# Patient Record
Sex: Male | Born: 1950 | Race: White | Hispanic: No | Smoking: Current every day smoker
Health system: Southern US, Community
[De-identification: ages and names within clinical notes are randomized; demographics above are authoritative.]

## PROBLEM LIST (undated history)

## (undated) DIAGNOSIS — E7800 Pure hypercholesterolemia, unspecified: Secondary | ICD-10-CM

## (undated) DIAGNOSIS — J449 Chronic obstructive pulmonary disease, unspecified: Secondary | ICD-10-CM

## (undated) DIAGNOSIS — I10 Essential (primary) hypertension: Secondary | ICD-10-CM

## (undated) DIAGNOSIS — R0600 Dyspnea, unspecified: Secondary | ICD-10-CM

## (undated) DIAGNOSIS — K219 Gastro-esophageal reflux disease without esophagitis: Secondary | ICD-10-CM

## (undated) DIAGNOSIS — R7303 Prediabetes: Secondary | ICD-10-CM

## (undated) DIAGNOSIS — Z8489 Family history of other specified conditions: Secondary | ICD-10-CM

## (undated) DIAGNOSIS — M1990 Unspecified osteoarthritis, unspecified site: Secondary | ICD-10-CM

## (undated) HISTORY — PX: CATARACT EXTRACTION: SUR2

## (undated) HISTORY — PX: LUMBAR DISC SURGERY: SHX700

---

## 2006-01-18 ENCOUNTER — Ambulatory Visit: Payer: Self-pay | Admitting: Gastroenterology

## 2006-03-14 ENCOUNTER — Ambulatory Visit: Payer: Self-pay | Admitting: Gastroenterology

## 2009-11-21 ENCOUNTER — Ambulatory Visit: Payer: Self-pay | Admitting: Unknown Physician Specialty

## 2017-09-24 HISTORY — PX: CARDIAC VALVE REPLACEMENT: SHX585

## 2018-06-06 DIAGNOSIS — I739 Peripheral vascular disease, unspecified: Secondary | ICD-10-CM | POA: Insufficient documentation

## 2018-06-09 DIAGNOSIS — I350 Nonrheumatic aortic (valve) stenosis: Secondary | ICD-10-CM | POA: Insufficient documentation

## 2018-06-15 DIAGNOSIS — I480 Paroxysmal atrial fibrillation: Secondary | ICD-10-CM | POA: Insufficient documentation

## 2018-06-15 DIAGNOSIS — G8918 Other acute postprocedural pain: Secondary | ICD-10-CM | POA: Insufficient documentation

## 2018-06-15 DIAGNOSIS — Z952 Presence of prosthetic heart valve: Secondary | ICD-10-CM | POA: Insufficient documentation

## 2018-06-23 DIAGNOSIS — Z7901 Long term (current) use of anticoagulants: Secondary | ICD-10-CM | POA: Insufficient documentation

## 2019-12-30 ENCOUNTER — Emergency Department (HOSPITAL_COMMUNITY): Payer: MEDICARE

## 2019-12-30 ENCOUNTER — Other Ambulatory Visit: Payer: Self-pay

## 2019-12-30 ENCOUNTER — Encounter (HOSPITAL_COMMUNITY): Payer: Self-pay | Admitting: Emergency Medicine

## 2019-12-30 ENCOUNTER — Inpatient Hospital Stay (HOSPITAL_COMMUNITY)
Admission: EM | Admit: 2019-12-30 | Discharge: 2019-12-31 | DRG: 069 | Disposition: A | Discharge status: Home / Self Care | Payer: MEDICARE | Attending: Family Medicine | Admitting: Family Medicine

## 2019-12-30 DIAGNOSIS — E785 Hyperlipidemia, unspecified: Secondary | ICD-10-CM | POA: Diagnosis not present

## 2019-12-30 DIAGNOSIS — Z87891 Personal history of nicotine dependence: Secondary | ICD-10-CM

## 2019-12-30 DIAGNOSIS — Z7982 Long term (current) use of aspirin: Secondary | ICD-10-CM | POA: Diagnosis not present

## 2019-12-30 DIAGNOSIS — H532 Diplopia: Secondary | ICD-10-CM | POA: Diagnosis present

## 2019-12-30 DIAGNOSIS — Z952 Presence of prosthetic heart valve: Secondary | ICD-10-CM

## 2019-12-30 DIAGNOSIS — Z20822 Contact with and (suspected) exposure to covid-19: Secondary | ICD-10-CM | POA: Diagnosis present

## 2019-12-30 DIAGNOSIS — R519 Headache, unspecified: Secondary | ICD-10-CM

## 2019-12-30 DIAGNOSIS — R29898 Other symptoms and signs involving the musculoskeletal system: Secondary | ICD-10-CM | POA: Diagnosis present

## 2019-12-30 DIAGNOSIS — G459 Transient cerebral ischemic attack, unspecified: Secondary | ICD-10-CM | POA: Diagnosis not present

## 2019-12-30 DIAGNOSIS — H4922 Sixth [abducent] nerve palsy, left eye: Secondary | ICD-10-CM | POA: Diagnosis present

## 2019-12-30 DIAGNOSIS — Z79899 Other long term (current) drug therapy: Secondary | ICD-10-CM | POA: Diagnosis not present

## 2019-12-30 DIAGNOSIS — I160 Hypertensive urgency: Secondary | ICD-10-CM | POA: Diagnosis present

## 2019-12-30 DIAGNOSIS — I10 Essential (primary) hypertension: Secondary | ICD-10-CM | POA: Diagnosis not present

## 2019-12-30 DIAGNOSIS — I161 Hypertensive emergency: Secondary | ICD-10-CM

## 2019-12-30 HISTORY — DX: Essential (primary) hypertension: I10

## 2019-12-30 LAB — BASIC METABOLIC PANEL
Anion gap: 10 (ref 5–15)
BUN: 12 mg/dL (ref 8–23)
CO2: 27 mmol/L (ref 22–32)
Calcium: 9.3 mg/dL (ref 8.9–10.3)
Chloride: 101 mmol/L (ref 98–111)
Creatinine, Ser: 0.77 mg/dL (ref 0.61–1.24)
GFR calc Af Amer: >60 mL/min (ref >60)
GFR calc non Af Amer: >60 mL/min (ref >60)
Glucose, Bld: 166 mg/dL — ABNORMAL HIGH (ref 70–99)
Potassium: 4 mmol/L (ref 3.5–5.1)
Sodium: 138 mmol/L (ref 135–145)

## 2019-12-30 LAB — PROTIME-INR
INR: 1.1 (ref 0.8–1.2)
Prothrombin Time: 13.7 seconds (ref 11.4–15.2)

## 2019-12-30 LAB — URINALYSIS, ROUTINE W REFLEX MICROSCOPIC
Bilirubin Urine: NEGATIVE
Glucose, UA: NEGATIVE mg/dL
Hgb urine dipstick: NEGATIVE
Ketones, ur: NEGATIVE mg/dL
Leukocytes,Ua: NEGATIVE
Nitrite: NEGATIVE
Protein, ur: NEGATIVE mg/dL
Specific Gravity, Urine: 1.015 (ref 1.005–1.030)
pH: 6 (ref 5.0–8.0)

## 2019-12-30 LAB — CBC WITH DIFFERENTIAL/PLATELET
Abs Immature Granulocytes: 0.03 10*3/uL (ref 0.00–0.07)
Basophils Absolute: 0 10*3/uL (ref 0.0–0.1)
Basophils Relative: 0 %
Eosinophils Absolute: 0.1 10*3/uL (ref 0.0–0.5)
Eosinophils Relative: 1 %
HCT: 43.3 % (ref 39.0–52.0)
Hemoglobin: 14.3 g/dL (ref 13.0–17.0)
Immature Granulocytes: 1 %
Lymphocytes Relative: 21 %
Lymphs Abs: 1.3 10*3/uL (ref 0.7–4.0)
MCH: 32.2 pg (ref 26.0–34.0)
MCHC: 33 g/dL (ref 30.0–36.0)
MCV: 97.5 fL (ref 80.0–100.0)
Monocytes Absolute: 0.6 10*3/uL (ref 0.1–1.0)
Monocytes Relative: 9 %
Neutro Abs: 4.4 10*3/uL (ref 1.7–7.7)
Neutrophils Relative %: 68 %
Platelets: 167 10*3/uL (ref 150–400)
RBC: 4.44 MIL/uL (ref 4.22–5.81)
RDW: 13.2 % (ref 11.5–15.5)
WBC: 6.3 10*3/uL (ref 4.0–10.5)
nRBC: 0 % (ref 0.0–0.2)

## 2019-12-30 LAB — CBC
HCT: 47.6 % (ref 39.0–52.0)
Hemoglobin: 15.5 g/dL (ref 13.0–17.0)
MCH: 31.6 pg (ref 26.0–34.0)
MCHC: 32.6 g/dL (ref 30.0–36.0)
MCV: 97.1 fL (ref 80.0–100.0)
Platelets: 160 10*3/uL (ref 150–400)
RBC: 4.9 MIL/uL (ref 4.22–5.81)
RDW: 13.2 % (ref 11.5–15.5)
WBC: 8.7 10*3/uL (ref 4.0–10.5)
nRBC: 0 % (ref 0.0–0.2)

## 2019-12-30 LAB — ETHANOL: Alcohol, Ethyl (B): <10 mg/dL (ref <10)

## 2019-12-30 MED ORDER — HYDRALAZINE HCL 20 MG/ML IJ SOLN
20.0000 mg | Freq: Once | INTRAMUSCULAR | Status: AC
  Administered 2019-12-30: 20 mg via INTRAVENOUS
  Filled 2019-12-30: qty 1

## 2019-12-30 MED ORDER — HYDRALAZINE HCL 20 MG/ML IJ SOLN
5.0000 mg | Freq: Once | INTRAMUSCULAR | Status: DC
  Administered 2019-12-30: 5 mg via INTRAVENOUS
  Filled 2019-12-30: qty 1

## 2019-12-30 MED ORDER — ACETAMINOPHEN 325 MG PO TABS: 650.0000 mg | ORAL_TABLET | ORAL | Status: DC | PRN

## 2019-12-30 MED ORDER — MORPHINE SULFATE (PF) 2 MG/ML IV SOLN
2.0000 mg | Freq: Once | INTRAVENOUS | Status: AC
  Administered 2019-12-30: 2 mg via INTRAVENOUS
  Filled 2019-12-30: qty 1

## 2019-12-30 MED ORDER — STROKE: EARLY STAGES OF RECOVERY BOOK
Freq: Once | Status: DC
  Filled 2019-12-30: qty 1

## 2019-12-30 MED ORDER — ENOXAPARIN SODIUM 40 MG/0.4ML ~~LOC~~ SOLN: 40.0000 mg | SUBCUTANEOUS | Status: DC

## 2019-12-30 MED ORDER — ACETAMINOPHEN 650 MG RE SUPP: 650.0000 mg | RECTAL | Status: DC | PRN

## 2019-12-30 MED ORDER — IOHEXOL 350 MG/ML SOLN
100.0000 mL | Freq: Once | INTRAVENOUS | Status: AC | PRN
  Administered 2019-12-30: 100 mL via INTRAVENOUS

## 2019-12-30 MED ORDER — HYDRALAZINE HCL 20 MG/ML IJ SOLN: 20.0000 mg | Freq: Four times a day (QID) | INTRAMUSCULAR | Status: DC | PRN

## 2019-12-30 MED ORDER — SODIUM CHLORIDE 0.9 % IV SOLN: INTRAVENOUS | Status: DC

## 2019-12-30 MED ORDER — ACETAMINOPHEN 160 MG/5ML PO SOLN: 650.0000 mg | ORAL | Status: DC | PRN

## 2019-12-30 NOTE — H&P (Signed)
History and Physical   Ryan Weeks. WFU:[id number] DOB: [birth year] DOA: 12/30/2019  Referring MD/NP/PA: Clarice Pole  PCP: Kandyce Rud, MD   Outpatient Specialists: None  Patient coming from: Home  Chief Complaint: Blurred vision and headache  HPI: Ryan Weeks. is a 69 y.o. male with medical history significant of hypertension who reported sudden onset of double vision today around 11:30 in the morning.  Patient was at work when he bent over and all of a sudden he started having very bad headache behind the left eye.  The headache has been continuous persistent rated as 9 out of 10.  Not much relief from anything.  He subsequently developed blurry vision in the left eye.  He was having difficulty accommodating and also focusing with his vision.  His get some improvement prior to coming it but still having double vision.  This is worse when he looks more to the left side.  He has associated nausea but no vomiting.  Headache is much better now in the ER after treatment.  Usually blood pressure is controlled but noted to have hypertensive urgency in the ER.  Neurology has been consulted at this point.  CVA suspected and patient is getting work-up for it..  ED Course: Temperature 98 for blood pressure 257/87 pulse 49 respirate 22 oxygen sats 94% room air.  CBC and chemistry entirely within normal.  Urinalysis essentially negative.  CT angiogram of the head without contrast showed no acute findings.  MRI of the brain has been ordered and further work-up for CVA ongoing.  He will be admitted to the hospital for work-up.  Review of Systems: As per HPI otherwise 10 point review of systems negative.    Past Medical History:  Diagnosis Date  . Hypertension     Past Surgical History:  Procedure Laterality Date  . CARDIAC VALVE REPLACEMENT  2019     has no history on file for tobacco, alcohol, and drug.  No Known Allergies  No family history on file.   Prior to Admission medications    Medication Sig Start Date End Date Taking? Authorizing Provider  acetaminophen (TYLENOL) 500 MG tablet Take 500 mg by mouth every 6 (six) hours as needed (for headaches).   Yes [provider]  aspirin 81 MG EC tablet Take 81 mg by mouth at bedtime.   Yes [provider]  cetirizine (ZYRTEC) 10 MG tablet Take 10 mg by mouth daily as needed (for seasonal allergies).   Yes [provider]  Cholecalciferol 25 MCG (1000 UT) capsule Take 1,000 Units by mouth in the morning.   Yes [provider]  losartan (COZAAR) 100 MG tablet Take 100 mg by mouth in the morning.  12/10/19  Yes [provider]  Magnesium 500 MG TABS Take 500 mg by mouth at bedtime.   Yes [provider]  metoprolol succinate (TOPROL-XL) 25 MG 24 hr tablet Take 25 mg by mouth daily. 10/28/19  Yes [provider]  pravastatin (PRAVACHOL) 20 MG tablet Take 20 mg by mouth at bedtime. 11/16/19  Yes [provider]    Physical Exam: Vitals:   12/30/19 2100 12/30/19 2115 12/30/19 2130 12/30/19 2145  BP: (!) 199/81 (!) 187/75 (!) 191/74 (!) 178/78  Pulse: 76 67 71 68  Resp: 17 17 (!) 22 20  Temp:      TempSrc:      SpO2: 95% 97% 96% 94%  Weight:      Height:  Constitutional: Anxious but no acute findings Vitals:   12/30/19 2100 12/30/19 2115 12/30/19 2130 12/30/19 2145  BP: (!) 199/81 (!) 187/75 (!) 191/74 (!) 178/78  Pulse: 76 67 71 68  Resp: 17 17 (!) 22 20  Temp:      TempSrc:      SpO2: 95% 97% 96% 94%  Weight:      Height:       Eyes: PERRL, lids and conjunctivae normal.  Slight left poor gaze ENMT: Mucous membranes are moist. Posterior pharynx clear of any exudate or lesions.Normal dentition.  Neck: normal, supple, no masses, no thyromegaly Respiratory: clear to auscultation bilaterally, no wheezing, no crackles. Normal respiratory effort. No accessory muscle use.  Cardiovascular: Regular rate and rhythm, no murmurs / rubs / gallops.  1+extremity edema. 2+ pedal pulses. No carotid bruits.  Abdomen: no tenderness, no masses palpated. No hepatosplenomegaly. Bowel sounds positive.  Musculoskeletal: no clubbing / cyanosis. No joint deformity upper and lower extremities. Good ROM, no contractures. Normal muscle tone.  Skin: no rashes, lesions, ulcers. No induration Neurologic: CN 2-12 grossly intact. Sensation intact, DTR normal. Strength 5/5 in all 4.  Has mild lagging in his lateral gaze to the left Psychiatric: Normal judgment and insight. Alert and oriented x 3. Normal mood.     Labs on Admission: I have personally reviewed following labs and imaging studies  CBC: Recent Labs  Lab 12/30/19 1354 12/30/19 2023  WBC 8.7 6.3  NEUTROABS  --  4.4  HGB 15.5 14.3  HCT 47.6 43.3  MCV 97.1 97.5  PLT 160 167   Basic Metabolic Panel: Recent Labs  Lab 12/30/19 1354  NA 138  K 4.0  CL 101  CO2 27  GLUCOSE 166*  BUN 12  CREATININE 0.77  CALCIUM 9.3   GFR: Estimated Creatinine Clearance: 93.8 mL/min (by C-G formula based on SCr of 0.77 mg/dL). Liver Function Tests: No results for input(s): AST, ALT, ALKPHOS, BILITOT, PROT, ALBUMIN in the last 168 hours. No results for input(s): LIPASE, AMYLASE in the last 168 hours. No results for input(s): AMMONIA in the last 168 hours. Coagulation Profile: Recent Labs  Lab 12/30/19 2023  INR 1.1   Cardiac Enzymes: No results for input(s): CKTOTAL, CKMB, CKMBINDEX, TROPONINI in the last 168 hours. BNP (last 3 results) No results for input(s): PROBNP in the last 8760 hours. HbA1C: No results for input(s): HGBA1C in the last 72 hours. CBG: No results for input(s): GLUCAP in the last 168 hours. Lipid Profile: No results for input(s): CHOL, HDL, LDLCALC, TRIG, CHOLHDL, LDLDIRECT in the last 72 hours. Thyroid Function Tests: No results for input(s): TSH, T4TOTAL, FREET4, T3FREE, THYROIDAB in the last 72 hours. Anemia Panel: No results for input(s): VITAMINB12, FOLATE,  FERRITIN, TIBC, IRON, RETICCTPCT in the last 72 hours. Urine analysis:    Component Value Date/Time   COLORURINE STRAW (A) 12/30/2019 2111   APPEARANCEUR CLEAR 12/30/2019 2111   LABSPEC 1.015 12/30/2019 2111   PHURINE 6.0 12/30/2019 2111   GLUCOSEU NEGATIVE 12/30/2019 2111   HGBUR NEGATIVE 12/30/2019 2111   BILIRUBINUR NEGATIVE 12/30/2019 2111   KETONESUR NEGATIVE 12/30/2019 2111   PROTEINUR NEGATIVE 12/30/2019 2111   NITRITE NEGATIVE 12/30/2019 2111   LEUKOCYTESUR NEGATIVE 12/30/2019 2111   Sepsis Labs: @LABRCNTIP (procalcitonin:4,lacticidven:4) )No results found for this or any previous visit (from the past 240 hour(s)).   Radiological Exams on Admission: CT Angio Head W/Cm &/Or Wo Cm  Result Date: 12/30/2019 CLINICAL DATA:  69 year old male with headache and blurred vision. Hypertension.  EXAM: CT ANGIOGRAPHY HEAD TECHNIQUE: Multidetector CT imaging of the head was performed using the standard protocol during bolus administration of intravenous contrast. Multiplanar CT image reconstructions and MIPs were obtained to evaluate the vascular anatomy. CONTRAST:  149mL OMNIPAQUE IOHEXOL 350 MG/ML SOLN COMPARISON:  Plain head CT earlier today. FINDINGS: Posterior circulation: The distal left vertebral artery is patent along with the left PICA. There is mild left V4 irregularity but no significant distal left vertebral stenosis. The distal right vertebral artery is not identified and likely occluded. The left vertebral artery supplies the basilar which is diminutive but patent without stenosis. The basilar functionally terminates in the SCA is, with fetal type bilateral PCA origins. Bilateral PCA branches are within normal limits. Anterior circulation: Patent distal cervical ICAs and both ICA siphons. Moderate left siphon calcified plaque but only mild left siphon stenosis results. Normal left ophthalmic and posterior communicating artery origins. On the right there is mild to moderate calcified  plaque with only mild supraclinoid segment stenosis. Right ophthalmic and posterior communicating artery origins are normal. Patent carotid termini with normal MCA and ACA origins. Right A1 is mildly dominant. Anterior communicating artery and bilateral ACA branches are within normal limits. Left MCA M1 segment and bifurcation are patent without stenosis. Left MCA branches are within normal limits. Right MCA M1 segment and bifurcation are patent without stenosis. Right MCA branches are within normal limits. Venous sinuses: Patent. Anatomic variants: Fetal type bilateral PCA origins with diminutive basilar artery. Review of the MIP images confirms the above findings IMPRESSION: 1. 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 2. The left vertebral artery supplies the basilar, and there is anterior and posterior large vessel atherosclerosis but no significant arterial stenosis identified. 3. Negative for intracranial aneurysm. Electronically Signed   By: Genevie Ann M.D.   On: 12/30/2019 21:00   CT Head Wo Contrast  Result Date: 12/30/2019 CLINICAL DATA:  Headache, intracranial hemorrhage suspected Hypertension and headache. Blurry vision. EXAM: CT HEAD WITHOUT CONTRAST TECHNIQUE: Contiguous axial images were obtained from the base of the skull through the vertex without intravenous contrast. COMPARISON:  None. FINDINGS: Brain: No intracranial hemorrhage, mass effect, or midline shift. No hydrocephalus. The basilar cisterns are patent. No evidence of territorial infarct or acute ischemia. No extra-axial or intracranial fluid collection. Vascular: Atherosclerosis of skullbase vasculature without hyperdense vessel or abnormal calcification. Skull: No fracture or focal lesion. Sinuses/Orbits: Paranasal sinuses and mastoid air cells are clear. The visualized orbits are unremarkable. Bilateral cataract  resection. Other: Scattered air in the venous structures typically related to IV catheter placement. IMPRESSION: No acute intracranial abnormality. Electronically Signed   By: Keith Rake M.D.   On: 12/30/2019 20:24    EKG: Independently reviewed.  It shows sinus rhythm with a rate of 67 and right bundle branch block.  No significant ST change except for flipped T waves on the lateral leads.  No old EKG to compare.  Assessment/Plan Principal Problem:   Hypertensive urgency Active Problems:   Diplopia   Hyperlipidemia     #1 hypertensive urgency: May because of patient's symptoms.  Improved with initial treatment in the ER.  Admit the patient on use IV medication as needed to control blood pressure.  Transition to oral agents at discharge.  #2 diplopia: Suspected TIA versus CVA.  Could be localized nerve damage including muscular palsy.  Appreciate neurology consultation.  Will complete  work-up for CVA with echocardiogram carotid Dopplers and follow MRI results.  #3 hyperlipidemia: Continue with statin.   DVT prophylaxis: Lovenox Code Status: Full code Family Communication: No family at bedside Disposition Plan: Home Consults called: Neurology Dr. Wilford Corner Admission status: Inpatient  Severity of Illness: The appropriate patient status for this patient is INPATIENT. Inpatient status is judged to be reasonable and necessary in order to provide the required intensity of service to ensure the patient's safety. The patient's presenting symptoms, physical exam findings, and initial radiographic and laboratory data in the context of their chronic comorbidities is felt to place them at high risk for further clinical deterioration. Furthermore, it is not anticipated that the patient will be medically stable for discharge from the hospital within 2 midnights of admission. The following factors support the patient status of inpatient.   " The patient's presenting symptoms include double  vision. " The worrisome physical exam findings include diplopia. " The initial radiographic and laboratory data are worrisome because of head CT. " The chronic co-morbidities include hypertension.   * I certify that at the point of admission it is my clinical judgment that the patient will require inpatient hospital care spanning beyond 2 midnights from the point of admission due to high intensity of service, high risk for further deterioration and high frequency of surveillance required.Lonia Blood MD [facility] Pager [phone]  If 7PM-7AM, please contact night-coverage [email or web address] Password [facility]  12/30/2019, 10:28 PM

## 2019-12-30 NOTE — ED Notes (Signed)
Pt to MRI

## 2019-12-30 NOTE — ED Notes (Signed)
Pt transported to CT ?

## 2019-12-30 NOTE — ED Triage Notes (Signed)
Pt reports HA and blurred vision starting around 11 today. No facial droop or weakness noted. HA better after tylenol. BP elevated.

## 2019-12-30 NOTE — ED Provider Notes (Signed)
[facility] EMERGENCY DEPARTMENT Provider Note   CSN: [id number] Arrival date & time: 12/30/19  1335     History Chief Complaint  Patient presents with  . Blurred Vision  . Headache    Ryan Weeks. is a 69 y.o. male.  HPI Patient reports that at about 11:30 AM at work.  He bent over and all of a sudden got a bad headache behind the left eye.  He reports that the headache persisted for a while and then started to ease off some.  He reports he got blurred vision in the left eye.  At the time, he reports his wife could observe that the eyes did not seem to be moving together, symmetrically.  He reports that symptom has improved quite a bit.  He still gets some double vision if he looks very far to the left.  Nausea or vomiting.  At onset, headache was 9/10 and now headache is 2/10 in severity.  Patient does not typically get headaches.  He reports his blood pressure is usually controlled in the 130s systolic.  He is compliant with his medications.  Patient does not take any anticoagulant medications.    Past Medical History:  Diagnosis Date  . Hypertension     There are no problems to display for this patient.   Past Surgical History:  Procedure Laterality Date  . CARDIAC VALVE REPLACEMENT  2019       No family history on file.  Social History   Tobacco Use  . Smoking status: Not on file  Substance Use Topics  . Alcohol use: Not on file  . Drug use: Not on file    Home Medications Prior to Admission medications   Medication Sig Start Date End Date Taking? Authorizing Provider  acetaminophen (TYLENOL) 500 MG tablet Take 500 mg by mouth every 6 (six) hours as needed (for headaches).   Yes [provider]  aspirin 81 MG EC tablet Take 81 mg by mouth at bedtime.   Yes [provider]  cetirizine (ZYRTEC) 10 MG tablet Take 10 mg by mouth daily as needed (for seasonal allergies).   Yes [provider]  Cholecalciferol 25  MCG (1000 UT) capsule Take 1,000 Units by mouth in the morning.   Yes [provider]  losartan (COZAAR) 100 MG tablet Take 100 mg by mouth in the morning.  12/10/19  Yes [provider]  Magnesium 500 MG TABS Take 500 mg by mouth at bedtime.   Yes [provider]  metoprolol succinate (TOPROL-XL) 25 MG 24 hr tablet Take 25 mg by mouth daily. 10/28/19  Yes [provider]  pravastatin (PRAVACHOL) 20 MG tablet Take 20 mg by mouth at bedtime. 11/16/19  Yes [provider]    Allergies    Patient has no known allergies.  Review of Systems   Review of Systems 10 Systems reviewed and are negative for acute change except as noted in the HPI.  Physical Exam Updated Vital Signs BP (!) 178/78   Pulse 68   Temp 98.4 F (36.9 C) (Oral)   Resp 20   Ht 5\' 10"  (1.778 m)   Wt 79.4 kg   SpO2 94%   BMI 25.11 kg/m   Physical Exam Constitutional:      Comments: Alert.  No acute distress.  Mental status clear.  Well-nourished well-developed.  No respiratory distress.  HENT:     Head: Normocephalic and atraumatic.     Nose:  Nose normal.     Mouth/Throat:     Mouth: Mucous membranes are moist.     Pharynx: Oropharynx is clear.  Eyes:     Comments: Pupils are symmetric.  Approximately 3 mm and responsive.  No scleral injection.  Cardiovascular:     Rate and Rhythm: Normal rate and regular rhythm.     Comments: 2\6 systolic ejection murmur. Pulmonary:     Effort: Pulmonary effort is normal.     Breath sounds: Normal breath sounds.  Abdominal:     General: There is no distension.     Palpations: Abdomen is soft.     Tenderness: There is no abdominal tenderness. There is no guarding.  Musculoskeletal:        General: No swelling or tenderness. Normal range of motion.     Cervical back: Neck supple.     Comments: Patient's ankles have moderate swelling with extensive small varicosities.  No significant edema.  Skin:    General: Skin is warm and dry.    Neurological:     Comments: Patient is alert and oriented.  Normal cognitive function.  Speech is clear.  No evidence of aphasia.  Pupils are symmetric and responsive.  Eyes appear symmetric to forward gaze.  With far left lateral gaze, left eye appears to have slight lag.  Patient reports onset of double vision with far left lateral gaze.  Face otherwise symmetric.  Normal finger-nose exam bilaterally.  Motor strength 5\5x4 extremities  Psychiatric:        Mood and Affect: Mood normal.     ED Results / Procedures / Treatments   Labs (all labs ordered are listed, but only abnormal results are displayed) Labs Reviewed  BASIC METABOLIC PANEL - Abnormal; Notable for the following components:      Result Value   Glucose, Bld 166 (*)    All other components within normal limits  URINALYSIS, ROUTINE W REFLEX MICROSCOPIC - Abnormal; Notable for the following components:   Color, Urine STRAW (*)    All other components within normal limits  SARS CORONAVIRUS 2 (TAT 6-24 HRS)  CBC  ETHANOL  CBC WITH DIFFERENTIAL/PLATELET  PROTIME-INR    EKG EKG Interpretation  Date/Time:  Wednesday December 30 2019 20:38:22 EDT Ventricular Rate:  67 PR Interval:    QRS Duration: 149 QT Interval:  445 QTC Calculation: 470 R Axis:   99 Text Interpretation: Sinus rhythm Right bundle branch block agree, no old comparison image but 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 Hypertension. EXAM: CT ANGIOGRAPHY HEAD TECHNIQUE: Multidetector CT imaging of the head was performed using the standard protocol during bolus administration of intravenous contrast. Multiplanar CT image reconstructions and MIPs were obtained to evaluate the vascular anatomy. CONTRAST:  139mL OMNIPAQUE IOHEXOL 350 MG/ML SOLN  COMPARISON:  Plain head CT earlier today. FINDINGS: Posterior circulation: The distal left vertebral artery is patent along with the left PICA. There is mild left V4 irregularity but no significant distal left vertebral stenosis. The distal right vertebral artery is not identified and likely occluded. The left vertebral artery supplies the basilar which is diminutive but patent without stenosis. The basilar functionally terminates in the SCA is, with fetal type bilateral PCA origins. Bilateral PCA branches are within normal limits. Anterior circulation: Patent distal cervical ICAs and both  ICA siphons. Moderate left siphon calcified plaque but only mild left siphon stenosis results. Normal left ophthalmic and posterior communicating artery origins. On the right there is mild to moderate calcified plaque with only mild supraclinoid segment stenosis. Right ophthalmic and posterior communicating artery origins are normal. Patent carotid termini with normal MCA and ACA origins. Right A1 is mildly dominant. Anterior communicating artery and bilateral ACA branches are within normal limits. Left MCA M1 segment and bifurcation are patent without stenosis. Left MCA branches are within normal limits. Right MCA M1 segment and bifurcation are patent without stenosis. Right MCA branches are within normal limits. Venous sinuses: Patent. Anatomic variants: Fetal type bilateral PCA origins with diminutive basilar artery. Review of the MIP images confirms the above findings IMPRESSION: 1. 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 2. The left vertebral artery supplies the basilar, and there is anterior and posterior large vessel atherosclerosis but no significant arterial stenosis identified. 3. Negative for intracranial aneurysm. Electronically Signed   By: Odessa Fleming M.D.   On: 12/30/2019 21:00   CT Head Wo  Contrast  Result Date: 12/30/2019 CLINICAL DATA:  Headache, intracranial hemorrhage suspected Hypertension and headache. Blurry vision. EXAM: CT HEAD WITHOUT CONTRAST TECHNIQUE: Contiguous axial images were obtained from the base of the skull through the vertex without intravenous contrast. COMPARISON:  None. FINDINGS: Brain: No intracranial hemorrhage, mass effect, or midline shift. No hydrocephalus. The basilar cisterns are patent. No evidence of territorial infarct or acute ischemia. No extra-axial or intracranial fluid collection. Vascular: Atherosclerosis of skullbase vasculature without hyperdense vessel or abnormal calcification. Skull: No fracture or focal lesion. Sinuses/Orbits: Paranasal sinuses and mastoid air cells are clear. The visualized orbits are unremarkable. Bilateral cataract resection. Other: Scattered air in the venous structures typically related to IV catheter placement. IMPRESSION: No acute intracranial abnormality. Electronically Signed   By: Narda Rutherford M.D.   On: 12/30/2019 20:24    Procedures Procedures (including critical care time) CRITICAL CARE Performed by: Arby Barrette   Total critical care time: 60 minutes  Critical care time was exclusive of separately billable procedures and treating other patients.  Critical care was necessary to treat or prevent imminent or life-threatening deterioration.  Critical care was time spent personally by me on the following activities: development of treatment plan with patient and/or surrogate as well as nursing, discussions with consultants, evaluation of patient's response to treatment, examination of patient, obtaining history from patient or surrogate, ordering and performing treatments and interventions, ordering and review of laboratory studies, ordering and review of radiographic studies, pulse oximetry and re-evaluation of patient's condition. Medications Ordered in ED Medications  hydrALAZINE (APRESOLINE) injection  20 mg (20 mg Intravenous Given 12/30/19 2020)  iohexol (OMNIPAQUE) 350 MG/ML injection 100 mL (100 mLs Intravenous Contrast Given 12/30/19 2043)  morphine 2 MG/ML injection 2 mg (2 mg Intravenous Given 12/30/19 2156)    ED Course  I have reviewed the triage vital signs and the nursing notes.  Pertinent labs & imaging results that were available during my care of the patient were reviewed by me and considered in my medical decision making (see chart for details).  Clinical Course as of Dec 29 2301  Wed Dec 30, 2019  2007 Repeat pressure while in the room 257/90 with cuff well sized and placed.  Patient in relaxed position.   [MP]  2028 Recheck blood pressure within 5 minutes  of hydralazine.  Down from 250/87 to 215/80.  Patient rates his headache as a 2 out of 10.  He reports at onset it was a 9 out of 10.  Mental status is alert with clear speech.  Finger-nose exam is normal.  Strength is 5\5.  Cognitive function normal.   [MP]  2055 Consult: I have reviewed case with Dr. Wilford Corner.  Goal blood pressure at this time approximately 200 systolic until completing CT angios and MRIs ruling out ischemic stroke.   [MP]  2221 Consult: Reviewed with Dr. Mikeal Hawthorne [facility] hospitalist for admission   [MP]    Clinical Course User Index [MP] Arby Barrette, MD   MDM Rules/Calculators/A&P                     Patient presents with acute onset of severe headache.  Patient also had severe hypertension.  Normally, his blood pressures are well controlled in the systolic ranges of 120s to 130s.  Patient reports he is compliant with his medications.  Symptoms included severe headache with unilateral blurred vision and diplopia with lateral gaze.  Patient evaluated by CT head and CT angiogram to rule out intracerebral hemorrhage or aneurysm.  None identified.  Blood pressure control initiated with hydralazine with good response.  Patient is also being evaluated by MRI to rule out stroke.  Mental status remains alert and  appropriate.  Patient will be admitted for ongoing management of hypertensive emergency with rule out for stroke. Final Clinical Impression(s) / ED Diagnoses Final diagnoses:  Bad headache  Hypertensive emergency  Diplopia    Rx / DC Orders ED Discharge Orders    None       Arby Barrette, MD 12/30/19 2305

## 2019-12-31 ENCOUNTER — Inpatient Hospital Stay (HOSPITAL_COMMUNITY): Payer: MEDICARE

## 2019-12-31 DIAGNOSIS — I160 Hypertensive urgency: Secondary | ICD-10-CM

## 2019-12-31 DIAGNOSIS — G459 Transient cerebral ischemic attack, unspecified: Secondary | ICD-10-CM | POA: Diagnosis not present

## 2019-12-31 LAB — CBC
HCT: 43.2 % (ref 39.0–52.0)
Hemoglobin: 14.7 g/dL (ref 13.0–17.0)
MCH: 32 pg (ref 26.0–34.0)
MCHC: 34 g/dL (ref 30.0–36.0)
MCV: 94.1 fL (ref 80.0–100.0)
Platelets: 169 10*3/uL (ref 150–400)
RBC: 4.59 MIL/uL (ref 4.22–5.81)
RDW: 13.4 % (ref 11.5–15.5)
WBC: 8.1 10*3/uL (ref 4.0–10.5)
nRBC: 0 % (ref 0.0–0.2)

## 2019-12-31 LAB — COMPREHENSIVE METABOLIC PANEL
ALT: 25 U/L (ref 0–44)
AST: 17 U/L (ref 15–41)
Albumin: 3.8 g/dL (ref 3.5–5.0)
Alkaline Phosphatase: 68 U/L (ref 38–126)
Anion gap: 10 (ref 5–15)
BUN: 9 mg/dL (ref 8–23)
CO2: 24 mmol/L (ref 22–32)
Calcium: 9.2 mg/dL (ref 8.9–10.3)
Chloride: 105 mmol/L (ref 98–111)
Creatinine, Ser: 0.6 mg/dL — ABNORMAL LOW (ref 0.61–1.24)
GFR calc Af Amer: >60 mL/min (ref >60)
GFR calc non Af Amer: >60 mL/min (ref >60)
Glucose, Bld: 115 mg/dL — ABNORMAL HIGH (ref 70–99)
Potassium: 3.5 mmol/L (ref 3.5–5.1)
Sodium: 139 mmol/L (ref 135–145)
Total Bilirubin: 0.9 mg/dL (ref 0.3–1.2)
Total Protein: 6.6 g/dL (ref 6.5–8.1)

## 2019-12-31 LAB — LIPID PANEL
Cholesterol: 155 mg/dL (ref 0–200)
HDL: 59 mg/dL (ref >40)
LDL Cholesterol: 85 mg/dL (ref 0–99)
Total CHOL/HDL Ratio: 2.6 RATIO
Triglycerides: 57 mg/dL (ref <150)
VLDL: 11 mg/dL (ref 0–40)

## 2019-12-31 LAB — C-REACTIVE PROTEIN: CRP: <0.5 mg/dL (ref <1.0)

## 2019-12-31 LAB — SEDIMENTATION RATE: Sed Rate: 5 mm/hr (ref 0–16)

## 2019-12-31 LAB — SARS CORONAVIRUS 2 (TAT 6-24 HRS): SARS Coronavirus 2: NEGATIVE

## 2019-12-31 LAB — HIV ANTIBODY (ROUTINE TESTING W REFLEX): HIV Screen 4th Generation wRfx: NONREACTIVE

## 2019-12-31 MED ORDER — VITAMIN D 25 MCG (1000 UNIT) PO TABS
1000.0000 [IU] | ORAL_TABLET | Freq: Every day | ORAL | Status: DC
  Administered 2019-12-31: 1000 [IU] via ORAL
  Filled 2019-12-31: qty 1

## 2019-12-31 MED ORDER — METOPROLOL SUCCINATE ER 25 MG PO TB24
25.0000 mg | ORAL_TABLET | Freq: Every day | ORAL | Status: DC
  Administered 2019-12-31: 25 mg via ORAL
  Filled 2019-12-31: qty 1

## 2019-12-31 MED ORDER — PRAVASTATIN SODIUM 80 MG PO TABS: 80.0000 mg | ORAL_TABLET | Freq: Every day | ORAL | 11 refills | Status: DC

## 2019-12-31 MED ORDER — LORATADINE 10 MG PO TABS
10.0000 mg | ORAL_TABLET | Freq: Every day | ORAL | Status: DC
  Administered 2019-12-31: 10 mg via ORAL
  Filled 2019-12-31: qty 1

## 2019-12-31 MED ORDER — ASPIRIN EC 81 MG PO TBEC
81.0000 mg | DELAYED_RELEASE_TABLET | Freq: Every day | ORAL | Status: DC
  Administered 2019-12-31: 81 mg via ORAL
  Filled 2019-12-31: qty 1

## 2019-12-31 MED ORDER — ACETAMINOPHEN 500 MG PO TABS: 500.0000 mg | ORAL_TABLET | Freq: Four times a day (QID) | ORAL | Status: DC | PRN

## 2019-12-31 MED ORDER — PRAVASTATIN SODIUM 10 MG PO TABS
20.0000 mg | ORAL_TABLET | Freq: Every day | ORAL | Status: DC
  Administered 2019-12-31: 20 mg via ORAL
  Filled 2019-12-31: qty 2

## 2019-12-31 MED ORDER — LOSARTAN POTASSIUM 50 MG PO TABS
100.0000 mg | ORAL_TABLET | Freq: Every day | ORAL | Status: DC
  Administered 2019-12-31: 100 mg via ORAL
  Filled 2019-12-31: qty 2

## 2019-12-31 MED ORDER — MAGNESIUM OXIDE 400 (241.3 MG) MG PO TABS
400.0000 mg | ORAL_TABLET | Freq: Every day | ORAL | Status: DC
  Administered 2019-12-31: 400 mg via ORAL
  Filled 2019-12-31: qty 1

## 2019-12-31 NOTE — Evaluation (Signed)
Physical Therapy Evaluation and discharge Patient Details Name: Ryan Weeks. MRN: [id number] DOB: Nov 29, 1950 Today's Date: 12/31/2019   History of Present Illness  Pt is a 69 y/o male admitted secondary to double vision and hypertensive urgency. MRI negative for acute abnormality. PMH includes HTN.    Clinical Impression  Patient evaluated by Physical Therapy with no further acute PT needs identified. All education has been completed and the patient has no further questions. Pt overall steady throughout mobility tasks. Had to keep one eye closed to help with double vision. Feel he would benefit from OT eval to further assess visual deficits. Do not feel he will require further acute PT services at this time as pt at a supervision to independent level with mobility. See below for any follow-up Physical Therapy or equipment needs. PT is signing off. Thank you for this referral. If needs change, please re-consult.      Follow Up Recommendations No PT follow up    Equipment Recommendations  None recommended by PT    Recommendations for Other Services       Precautions / Restrictions Precautions Precautions: Fall Precaution Comments: double vision Restrictions Weight Bearing Restrictions: No      Mobility  Bed Mobility Overal bed mobility: Independent                Transfers Overall transfer level: Independent Equipment used: None                Ambulation/Gait Ambulation/Gait assistance: Supervision;Min guard Gait Distance (Feet): 150 Feet Assistive device: None Gait Pattern/deviations: Step-through pattern;Decreased stride length Gait velocity: WFL    General Gait Details: Mild unsteadiness noted when turning head to L, however, no overt LOB noted. Pt reports double vision with both eyes open, so kept one closed throughout. Did not note any balance deficits when looking to the R or looking up.    Stairs            Wheelchair Mobility    Modified  Rankin (Stroke Patients Only)       Balance Overall balance assessment: Mild deficits observed, not formally tested                                           Pertinent Vitals/Pain Pain Assessment: No/denies pain    Home Living Family/patient expects to be discharged to:: Private residence Living Arrangements: Spouse/significant other Available Help at Discharge: Family;Available 24 hours/day Type of Home: House Home Access: Stairs to enter Entrance Stairs-Rails: Right Entrance Stairs-Number of Steps: 3-4 Home Layout: One level Home Equipment: Walker - 2 wheels;Cane - single point;Bedside commode      Prior Function Level of Independence: Independent         Comments: enjoys Dance movement psychotherapist        Extremity/Trunk Assessment   Upper Extremity Assessment Upper Extremity Assessment: Overall WFL for tasks assessed    Lower Extremity Assessment Lower Extremity Assessment: Overall WFL for tasks assessed    Cervical / Trunk Assessment Cervical / Trunk Assessment: Normal  Communication   Communication: No difficulties  Cognition Arousal/Alertness: Awake/alert Behavior During Therapy: WFL for tasks assessed/performed Overall Cognitive Status: Within Functional Limits for tasks assessed  General Comments General comments (skin integrity, edema, etc.): Pt's wife present during session. Educated about safety with stair navigation and with navigating obstacles at home given visual deficits.     Exercises     Assessment/Plan    PT Assessment Patent does not need any further PT services  PT Problem List         PT Treatment Interventions      PT Goals (Current goals can be found in the Care Plan section)  Acute Rehab PT Goals Patient Stated Goal: to go home PT Goal Formulation: With patient Time For Goal Achievement: 12/31/19 Potential to Achieve Goals: Good     Frequency     Barriers to discharge        Co-evaluation               AM-PAC PT "6 Clicks" Mobility  Outcome Measure Help needed turning from your back to your side while in a flat bed without using bedrails?: None Help needed moving from lying on your back to sitting on the side of a flat bed without using bedrails?: None Help needed moving to and from a bed to a chair (including a wheelchair)?: None Help needed standing up from a chair using your arms (e.g., wheelchair or bedside chair)?: None Help needed to walk in hospital room?: None Help needed climbing 3-5 steps with a railing? : None 6 Click Score: 24    End of Session Equipment Utilized During Treatment: Gait belt Activity Tolerance: Patient tolerated treatment well Patient left: in bed;with call bell/phone within reach;with family/visitor present Nurse Communication: Mobility status PT Visit Diagnosis: Other symptoms and signs involving the nervous system (R29.898)    Time: 1131-1150 PT Time Calculation (min) (ACUTE ONLY): 19 min   Charges:   PT Evaluation $PT Eval Low Complexity: 1 Low          Lou Miner, DPT  Acute Rehabilitation Services  Pager: [phone] Office: [phone]   Rudean Hitt 12/31/2019, 1:35 PM

## 2019-12-31 NOTE — Evaluation (Signed)
Occupational Therapy Evaluation Patient Details Name: Ryan Weeks. MRN: [id number] DOB: January 07, 1951 Today's Date: 12/31/2019    History of Present Illness Pt is a 69 y/o male admitted secondary to double vision and hypertensive urgency.    Clinical Impression   PTA pt living independently with spouse. At time of eval demonstrates ability to complete BADL at mobility at independent level. Evaluation with focus on new onset diplopia. Visual assessment findings detailed below. Pt primarly has side by side diplopia in L visual field. It disappears with occlusion, L eye appears more impaired than R. Occluded pts personal glasses with education to both pt and wife how to lessen occlusion to increase visual strength. Educated both pt and wife on low vision safety in home and community to reduce risks of falls. Recommend pt follow up with OP OT for continued low vision training and progression. Thank you for this consult.     Follow Up Recommendations  Outpatient OT    Equipment Recommendations  Other (comment)(visual occlusion glasses)    Recommendations for Other Services       Precautions / Restrictions Precautions Precautions: Fall Precaution Comments: double vision Restrictions Weight Bearing Restrictions: No      Mobility Bed Mobility Overal bed mobility: Independent                Transfers Overall transfer level: Independent Equipment used: None                  Balance Overall balance assessment: Mild deficits observed, not formally tested                                         ADL either performed or assessed with clinical judgement   ADL Overall ADL's : Independent;At baseline                                             Vision Baseline Vision/History: Wears glasses Wears Glasses: Reading only Patient Visual Report: Diplopia Vision Assessment?: Yes Eye Alignment: Impaired (comment)(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, still mildly present) Ocular Range of Motion: Within Functional Limits Alignment/Gaze Preference: Within Defined Limits Tracking/Visual Pursuits: Decreased smoothness of vertical tracking;Other (comment)(due to increased double vision on L) Saccades: Additional eye shifts occurred during testing Convergence: Impaired (comment)(L eye behind) Visual Fields: No apparent deficits Diplopia Assessment: Disappears with one eye closed;Objects split side to side;Present in near gaze;Present in far gaze;Only with left gaze Depth Perception: (overall impaired on L due to diplopia) Additional Comments: visual occlusion glasses provided with education going home with pt and wife     Perception     Praxis      Pertinent Vitals/Pain Pain Assessment: No/denies pain     Hand Dominance     Extremity/Trunk Assessment Upper Extremity Assessment Upper Extremity Assessment: Overall WFL for tasks assessed   Lower Extremity Assessment Lower Extremity Assessment: Overall WFL for tasks assessed   Cervical / Trunk Assessment Cervical / Trunk Assessment: Normal   Communication Communication Communication: No difficulties   Cognition Arousal/Alertness: Awake/alert Behavior During Therapy: WFL for tasks assessed/performed Overall Cognitive Status: Within Functional Limits for tasks assessed  General Comments  Pt's wife present during session. Educated about safety with stair navigation and with navigating obstacles at home given visual deficits.     Exercises     Shoulder Instructions      Home Living Family/patient expects to be discharged to:: Private residence Living Arrangements: Spouse/significant other Available Help at Discharge: Family;Available 24 hours/day Type of Home: House Home Access: Stairs to enter [employer] of Steps: 3-4 Entrance Stairs-Rails: Right Home Layout: One level          Bathroom Toilet: Standard     Home Equipment: Environmental consultant - 2 wheels;Cane - single point;Bedside commode          Prior Functioning/Environment Level of Independence: Independent        Comments: enjoys woodworking        OT Problem List: Impaired vision/perception;Impaired balance (sitting and/or standing)      OT Treatment/Interventions:      OT Goals(Current goals can be found in the care plan section) Acute Rehab OT Goals Patient Stated Goal: to go home OT Goal Formulation: With patient Time For Goal Achievement: 01/14/20 Potential to Achieve Goals: Good  OT Frequency:     Barriers to D/C:            Co-evaluation              AM-PAC OT "6 Clicks" Daily Activity     Outcome Measure Help from another person eating meals?: None Help from another person taking care of personal grooming?: None Help from another person toileting, which includes using toliet, bedpan, or urinal?: None Help from another person bathing (including washing, rinsing, drying)?: None Help from another person to put on and taking off regular upper body clothing?: None Help from another person to put on and taking off regular lower body clothing?: None 6 Click Score: 24   End of Session Nurse Communication: Mobility status  Activity Tolerance: Patient tolerated treatment well Patient left: in bed  OT Visit Diagnosis: Low vision, both eyes (H54.2)                Time: 8366-2947 OT Time Calculation (min): 20 min Charges:  OT General Charges $OT Visit: 1 Visit OT Evaluation $OT Eval Moderate Complexity: 1 Mod  Zenovia Jarred, MSOT, OTR/L Acute Rehabilitation Services [facility] Office Number: [phone] Pager: [phone]  Zenovia Jarred 12/31/2019, 1:57 PM

## 2019-12-31 NOTE — Progress Notes (Signed)
Attempted Code 44 patient had already left.

## 2019-12-31 NOTE — Discharge Summary (Signed)
Physician Discharge Summary  Ryan Weeks. OHY:[id number] DOB: [birth year] DOA: 12/30/2019  PCP: Derinda Late, MD  Admit date: 12/30/2019 Discharge date: 12/31/2019  Time spent: 45 minutes  Recommendations for Outpatient Follow-up:  1. Recommend smoking cessation counseling outpatient 2. Increased Plavix 20-->80 mg this admission 3. Therapy services as per OT who will eventually see the patient prior to discharge  Discharge Diagnoses:  Principal Problem:   Hypertensive urgency Active Problems:   Diplopia   Hyperlipidemia   Discharge Condition: Improved  Diet recommendation: Heart healthy  Filed Weights   12/30/19 1343  Weight: 79.4 kg    History of present illness:   69 year old black male bicuspid aortic valve status post AVR + prosthesis [facility] 06/13/2018 Dr. Arnoldo Hooker A. 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 Lorraine Lax of neurology who does not recommend any further work-up (carotids, echo) with regards to work-up for myasthenia hypothyroid or other causes We performed a CRP and an ESR that was negative so main management is supportive Occupational Therapy was contacted to ensure that the patient had appropriate follow-up recommendations regarding possible patching or prism use of the left eye Dr. Lorraine Lax recommended increasing the statin to high  intensity dosing so I have changed him to Pravachol 80 Currently on aspirin 81 at bedtime I have counseled him strongly on cessation of smoking and he does not wish any cessation aids  Bicuspid aortic valve status post AVR and prosthesis Follows at [facility]  losartan 100 Toprol-XL 25 for medical management  HLD Continue Pravachol as above  Prior multilevel degenerative disc disease status post surgery Not on any meds for pain at this time-monitor   Procedures: CT head MRI  Consultations: neurology discussed with Dr. Lorraine Lax  Discharge Exam: Vitals:   12/31/19 1045 12/31/19 1100  BP: (!) 155/73 (!) 160/64  Pulse: (!) 48 (!) 50  Resp: 19 18  Temp:    SpO2: 96% 94%    General: Awake alert no issue, left abducens palsy with lateral and horizontal gaze  power is 5/5 with sensation intact bilaterally no focal deficit finger-nose-finger is intact sensory is intact Cardiovascular: S1-S2 no murmur Respiratory: Clinically clear no added sound Abdomen soft  Discharge Instructions   Discharge Instructions    Diet - low sodium heart healthy   Complete by: As directed    Discharge instructions   Complete by: As directed    You were diagnosed with a cranial nerve palsy--- the actual reason for this is unclear but we did a limited work-up and we did not find any elevated blood work to suggest an autoimmune cause and your imaging did not show any overt risk of stroke-that being said you will need higher dose of your statin after my discussion with the neurologist as this could have been an ischemic/cardiovascular equivalent risk-the main thing to decrease your risk of this recurring overall is smoking good blood pressure  control etc. It is okay for you to take the rest your medications without change-you do not need to change the dose of the aspirin that you are on given current guidelines I would recommend close follow-up in the outpatient setting with your  primary physician and once the occupational therapist sees you they can identify if you will require patching of your left eye-usually you will have improvement of vision and double vision in the outpatient setting as early as 3 weeks   Increase activity slowly   Complete by: As directed      Allergies as of 12/31/2019   No Known Allergies     Medication List    TAKE these medications   acetaminophen 500 MG tablet Commonly known as: TYLENOL Take 500 mg by mouth every 6 (six) hours as needed (for headaches).   aspirin 81 MG EC tablet Take 81 mg by mouth at bedtime.   cetirizine 10 MG tablet Commonly known as: ZYRTEC Take 10 mg by mouth daily as needed (for seasonal allergies).   Cholecalciferol 25 MCG (1000 UT) capsule Take 1,000 Units by mouth in the morning.   losartan 100 MG tablet Commonly known as: COZAAR Take 100 mg by mouth in the morning.   Magnesium 500 MG Tabs Take 500 mg by mouth at bedtime.   metoprolol succinate 25 MG 24 hr tablet Commonly known as: TOPROL-XL Take 25 mg by mouth daily.   pravastatin 80 MG tablet Commonly known as: PRAVACHOL Take 1 tablet (80 mg total) by mouth at bedtime. What changed:   medication strength  how much to take      No Known Allergies    The results of significant diagnostics from this hospitalization (including imaging, microbiology, ancillary and laboratory) are listed below for reference.    Significant Diagnostic Studies: CT Angio Head W/Cm &/Or Wo Cm  Result Date: 12/30/2019 CLINICAL DATA:  69 year old male with headache and blurred vision. Hypertension. EXAM: CT ANGIOGRAPHY HEAD TECHNIQUE: Multidetector CT imaging of the head was performed using the standard protocol during bolus administration of intravenous contrast. Multiplanar CT image reconstructions and MIPs were obtained to evaluate the vascular anatomy. CONTRAST:  124m OMNIPAQUE IOHEXOL 350 MG/ML SOLN COMPARISON:  Plain head CT earlier today. FINDINGS:  Posterior circulation: The distal left vertebral artery is patent along with the left PICA. There is mild left V4 irregularity but no significant distal left vertebral stenosis. The distal right vertebral artery is not identified and likely occluded. The left vertebral artery supplies the basilar which is diminutive but patent without stenosis. The basilar functionally terminates in the SCA is, with fetal type bilateral PCA origins. Bilateral PCA branches are within normal limits. Anterior circulation: Patent distal cervical ICAs and both ICA siphons. Moderate left siphon calcified plaque but only mild left siphon stenosis results. Normal left ophthalmic and posterior communicating artery origins. On the right there is mild to moderate calcified plaque with only mild supraclinoid segment stenosis. Right ophthalmic and posterior communicating artery origins are normal. Patent carotid termini with normal MCA and ACA origins. Right A1 is mildly dominant. Anterior communicating artery and bilateral ACA branches are within normal limits. Left MCA M1 segment and bifurcation are patent without stenosis. Left MCA branches are within normal limits. Right MCA M1 segment and bifurcation are patent without stenosis. Right MCA branches are within normal limits. Venous sinuses: Patent. Anatomic variants: Fetal type bilateral PCA origins with diminutive basilar artery. Review of the MIP images confirms the above findings IMPRESSION: 1. The distal  right vertebral artery appears occluded, which is age indeterminate but probably chronic in light of no acute head CT findings in the posterior fossa. 2. The left vertebral artery supplies the basilar, and there is anterior and posterior large vessel atherosclerosis but no significant arterial stenosis identified. 3. Negative for intracranial aneurysm. Electronically Signed   By: Genevie Ann M.D.   On: 12/30/2019 21:00   CT Head Wo Contrast  Result Date: 12/30/2019 CLINICAL DATA:   Headache, intracranial hemorrhage suspected Hypertension and headache. Blurry vision. EXAM: CT HEAD WITHOUT CONTRAST TECHNIQUE: Contiguous axial images were obtained from the base of the skull through the vertex without intravenous contrast. COMPARISON:  None. FINDINGS: Brain: No intracranial hemorrhage, mass effect, or midline shift. No hydrocephalus. The basilar cisterns are patent. No evidence of territorial infarct or acute ischemia. No extra-axial or intracranial fluid collection. Vascular: Atherosclerosis of skullbase vasculature without hyperdense vessel or abnormal calcification. Skull: No fracture or focal lesion. Sinuses/Orbits: Paranasal sinuses and mastoid air cells are clear. The visualized orbits are unremarkable. Bilateral cataract resection. Other: Scattered air in the venous structures typically related to IV catheter placement. IMPRESSION: No acute intracranial abnormality. Electronically Signed   By: Keith Rake M.D.   On: 12/30/2019 20:24   MR Brain Wo Contrast (neuro protocol)  Result Date: 12/30/2019 CLINICAL DATA:  69 year old male with headache and blurred vision since 1100 hours. Distal right vertebral artery occlusion on head CTA earlier tonight. EXAM: MRI HEAD WITHOUT CONTRAST TECHNIQUE: Multiplanar, multiecho pulse sequences of the brain and surrounding structures were obtained without intravenous contrast. COMPARISON:  CT head and CTA head earlier tonight. FINDINGS: Brain: No restricted diffusion to suggest acute infarction. No midline shift, mass effect, evidence of mass lesion, ventriculomegaly, extra-axial collection or acute intracranial hemorrhage. Cervicomedullary junction and pituitary are within normal limits. Widely scattered but generally small foci of cerebral white matter T2 and FLAIR hyperintensity in a nonspecific configuration. No cortical encephalomalacia or chronic cerebral blood products identified. No signal abnormality in the deep gray nuclei, brainstem, or  cerebellum. Vascular: Major intracranial vascular flow voids are consistent with the earlier CTA findings. And there is no right vertebral artery flow void in the visible neck either. Skull and upper cervical spine: Negative visible cervical spine and bone marrow signal. Sinuses/Orbits: Postoperative changes to both globes. Trace paranasal sinus mucosal thickening. Other: Mastoids are clear. Visible internal auditory structures appear normal. Scalp and face soft tissues appear negative. IMPRESSION: 1. No acute intracranial abnormality 2. Chronic occlusion of the Right Vertebral Artery, with no signal abnormality in the brainstem or cerebellum. 3. Mild to moderate for age cerebral white matter signal changes are nonspecific but most commonly due to chronic small vessel disease. Electronically Signed   By: Genevie Ann M.D.   On: 12/30/2019 23:05    Microbiology: Recent Results (from the past 240 hour(s))  SARS CORONAVIRUS 2 (TAT 6-24 HRS) Nasopharyngeal Nasopharyngeal Swab     Status: None   Collection Time: 12/31/19  1:58 AM   Specimen: Nasopharyngeal Swab  Result Value Ref Range Status   SARS Coronavirus 2 NEGATIVE NEGATIVE Final    Comment: (NOTE) SARS-CoV-2 target nucleic acids are NOT DETECTED. The SARS-CoV-2 RNA is generally detectable in upper and lower respiratory specimens during the acute phase of infection. Negative results do not preclude SARS-CoV-2 infection, do not rule out co-infections with other pathogens, and should not be used as the sole basis for treatment or other patient management decisions. Negative results must be combined with clinical observations, patient  history, and epidemiological information. The expected result is Negative. Fact Sheet for Patients: SugarRoll.be Fact Sheet for Healthcare Providers: https://www.woods-mathews.com/ This test is not yet approved or cleared by the [place] FDA and  has been authorized for  detection and/or diagnosis of SARS-CoV-2 by FDA under an Emergency Use Authorization (EUA). This EUA will remain  in effect (meaning this test can be used) for the duration of the COVID-19 declaration under Section 56 4(b)(1) of the Act, 21 U.S.C. section 360bbb-3(b)(1), unless the authorization is terminated or revoked sooner. Performed at [facility], [place] [address], [place],  [address]      Labs: Basic Metabolic Panel: Recent Labs  Lab 12/30/19 1354 12/31/19 0240  NA 138 139  K 4.0 3.5  CL 101 105  CO2 27 24  GLUCOSE 166* 115*  BUN 12 9  CREATININE 0.77 0.60*  CALCIUM 9.3 9.2   Liver Function Tests: Recent Labs  Lab 12/31/19 0240  AST 17  ALT 25  ALKPHOS 68  BILITOT 0.9  PROT 6.6  ALBUMIN 3.8   No results for input(s): LIPASE, AMYLASE in the last 168 hours. No results for input(s): AMMONIA in the last 168 hours. CBC: Recent Labs  Lab 12/30/19 1354 12/30/19 2023 12/31/19 0240  WBC 8.7 6.3 8.1  NEUTROABS  --  4.4  --   HGB 15.5 14.3 14.7  HCT 47.6 43.3 43.2  MCV 97.1 97.5 94.1  PLT 160 167 169   Cardiac Enzymes: No results for input(s): CKTOTAL, CKMB, CKMBINDEX, TROPONINI in the last 168 hours. BNP: BNP (last 3 results) No results for input(s): BNP in the last 8760 hours.  ProBNP (last 3 results) No results for input(s): PROBNP in the last 8760 hours.  CBG: No results for input(s): GLUCAP in the last 168 hours.     Signed:  Nita Sells MD   [facility] 12/31/2019, 12:13 PM

## 2019-12-31 NOTE — ED Notes (Signed)
Patient verbalizes understanding of discharge instructions. Opportunity for questioning and answers were provided. Armband removed by staff, pt discharged from ED. Wheeled out to lobby  

## 2020-01-01 LAB — HEMOGLOBIN A1C
Hgb A1c MFr Bld: 5.8 % — ABNORMAL HIGH (ref 4.8–5.6)
Mean Plasma Glucose: 120 mg/dL

## 2020-03-10 ENCOUNTER — Other Ambulatory Visit: Payer: Self-pay | Admitting: Family Medicine

## 2020-03-10 DIAGNOSIS — H532 Diplopia: Secondary | ICD-10-CM

## 2020-03-14 ENCOUNTER — Ambulatory Visit
Admission: RE | Admit: 2020-03-14 | Discharge: 2020-03-14 | Disposition: A | Discharge status: Home / Self Care | Payer: Medicare HMO | Source: Ambulatory Visit | Attending: Family Medicine | Admitting: Family Medicine

## 2020-03-14 ENCOUNTER — Other Ambulatory Visit: Payer: Self-pay

## 2020-03-14 DIAGNOSIS — H532 Diplopia: Secondary | ICD-10-CM | POA: Insufficient documentation

## 2021-04-21 IMAGING — CT CT ANGIO HEAD
1 of 3 series · 8 of 47 positions shown · IV contrast (APPLIED)
Comparison: Plain head CT earlier today.

CLINICAL DATA: 68-year-old male with headache and blurred vision.
Hypertension.

EXAM:
CT ANGIOGRAPHY HEAD
TECHNIQUE: Multidetector CT imaging of the head was performed using the
standard protocol during bolus administration of intravenous
contrast. Multiplanar CT image reconstructions and MIPs were
obtained to evaluate the vascular anatomy.
CONTRAST:  100mL OMNIPAQUE IOHEXOL 350 MG/ML SOLN

[Series 7: headangio 1.0 mpr cor · coronal · 0.31mm/px · 8 of 214 slices shown]
[im 24/214  brain]
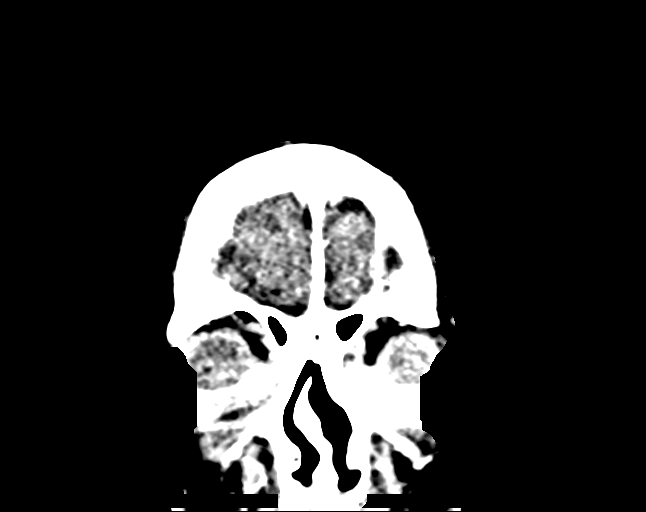
[im 48/214  bone]
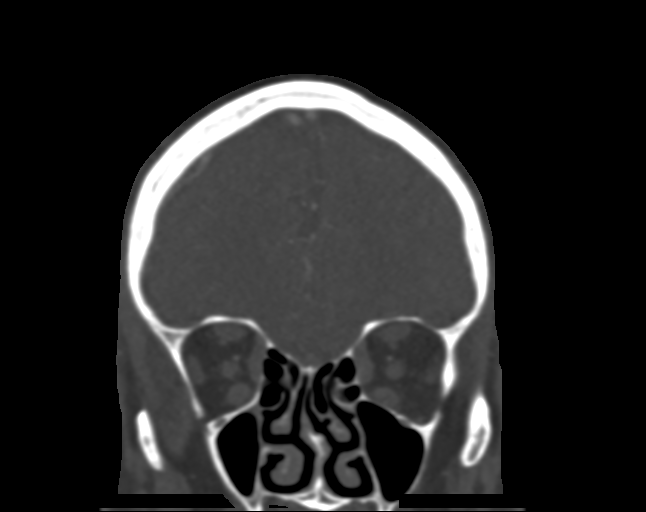
[im 72/214  brain]
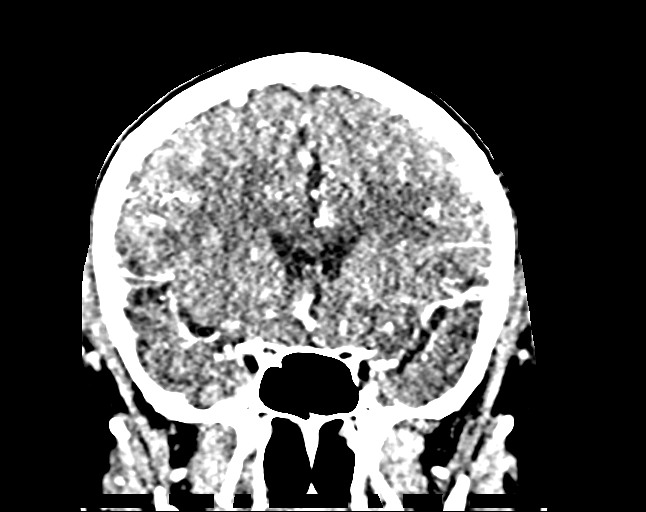
[im 95/214  bone]
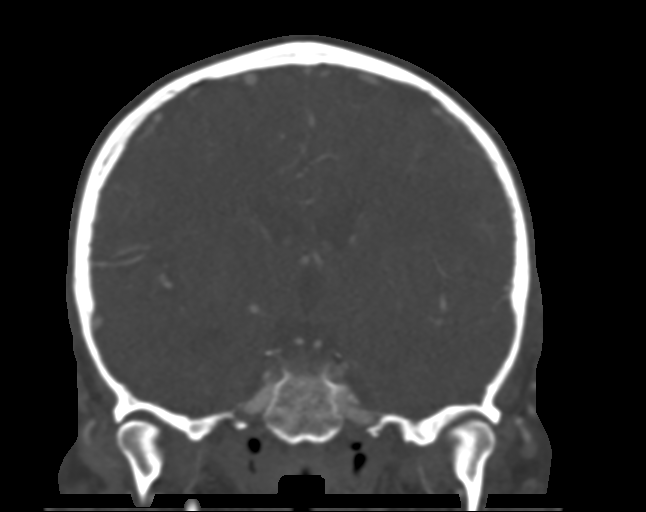
[im 119/214  brain]
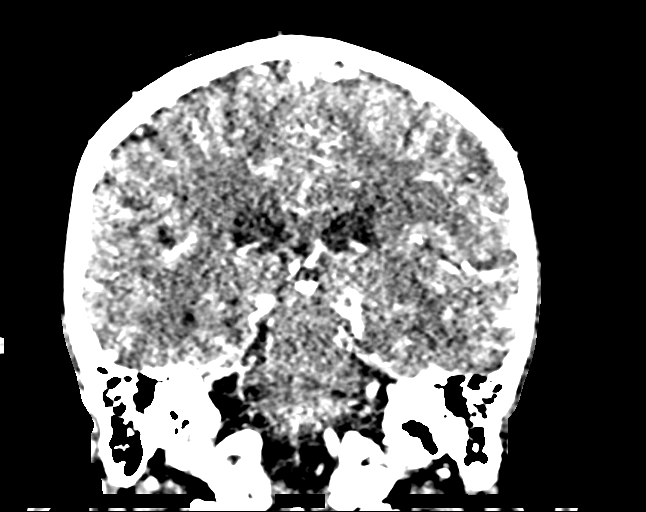
[im 143/214  bone]
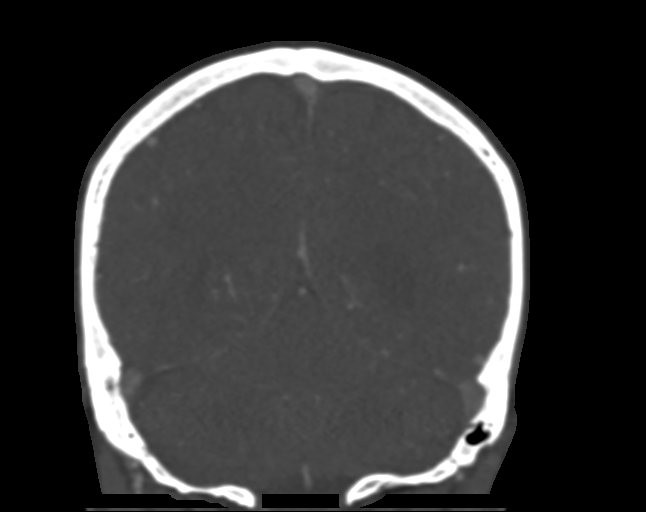
[im 166/214  brain]
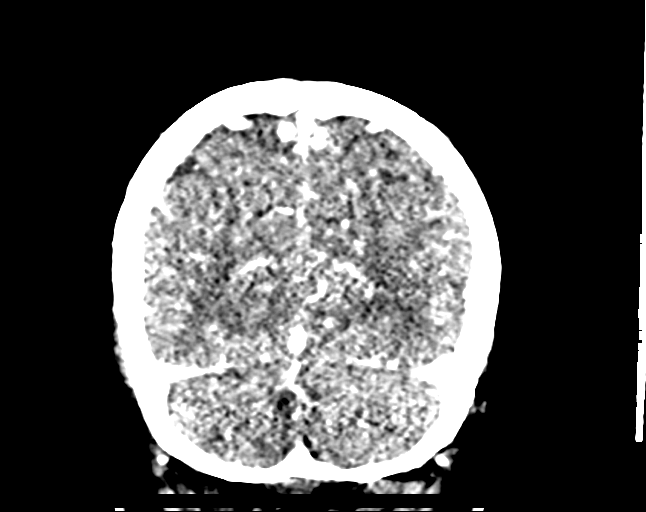
[im 190/214  bone]
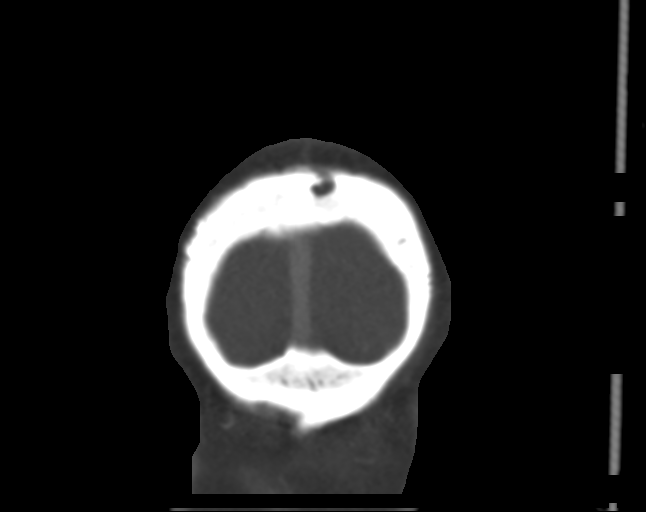

[8 of 47 positions shown; findings below may reference images not displayed]

FINDINGS: Posterior circulation: The distal left vertebral artery is patent
along with the left PICA. There is mild left V4 irregularity but no
significant distal left vertebral stenosis. The distal right
vertebral artery is not identified and likely occluded.

The left vertebral artery supplies the basilar which is diminutive
but patent without stenosis. The basilar functionally terminates in
the SCA is, with fetal type bilateral PCA origins. Bilateral PCA
branches are within normal limits.

Anterior circulation: Patent distal cervical ICAs and both ICA
siphons. Moderate left siphon calcified plaque but only mild left
siphon stenosis results. Normal left ophthalmic and posterior
communicating artery origins. On the right there is mild to moderate
calcified plaque with only mild supraclinoid segment stenosis. Right
ophthalmic and posterior communicating artery origins are normal.

Patent carotid termini with normal MCA and ACA origins. Right A1 is
mildly dominant. Anterior communicating artery and bilateral ACA
branches are within normal limits. Left MCA M1 segment and
bifurcation are patent without stenosis. Left MCA branches are
within normal limits. Right MCA M1 segment and bifurcation are
patent without stenosis. Right MCA branches are within normal
limits.

Venous sinuses: Patent.

Anatomic variants: Fetal type bilateral PCA origins with diminutive
basilar artery.

Review of the MIP images confirms the above findings
IMPRESSION: 1. The distal right vertebral artery appears occluded, which is age
indeterminate but probably chronic in light of no acute head CT
findings in the posterior fossa.
2. The left vertebral artery supplies the basilar, and there is
anterior and posterior large vessel atherosclerosis but no
significant arterial stenosis identified.
3. Negative for intracranial aneurysm.

## 2021-04-21 IMAGING — CT CT HEAD W/O CM
4 series · 16 of 47 positions shown, 18 images · non-contrast
Comparison: None.

CLINICAL DATA: Headache, intracranial hemorrhage suspected

Hypertension and headache. Blurry vision.
EXAM:
CT HEAD WITHOUT CONTRAST
TECHNIQUE: Contiguous axial images were obtained from the base of the skull
through the vertex without intravenous contrast.

[Series 2: head wo · axial · 0.46mm/px · z∈[+1277,+1397]mm · 7 of 32 slices shown, 9 images]
[im 4/32  brain]
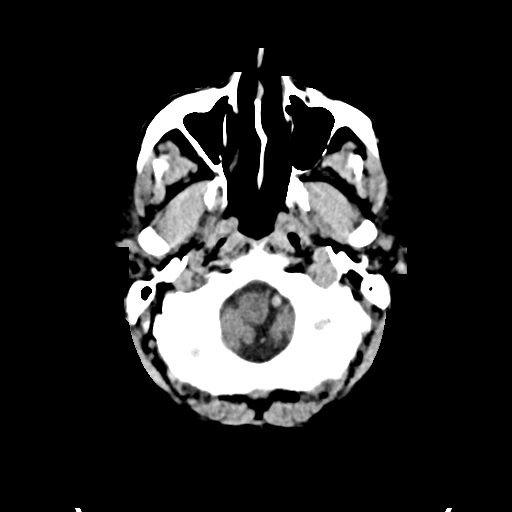
[im 4/32  bone]
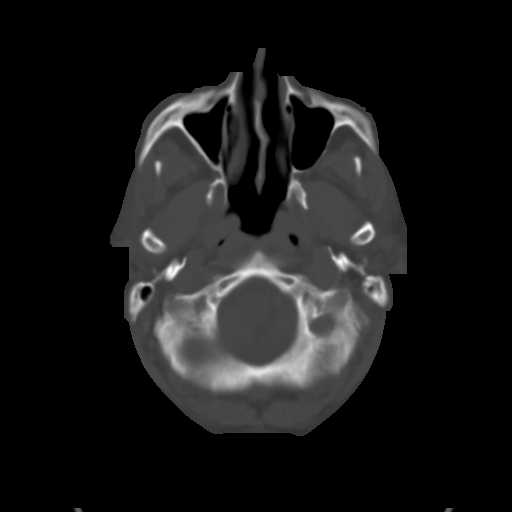
[im 8/32  brain]
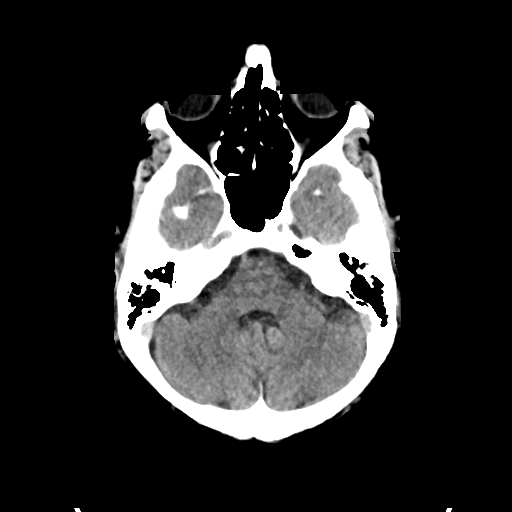
[im 12/32  brain]
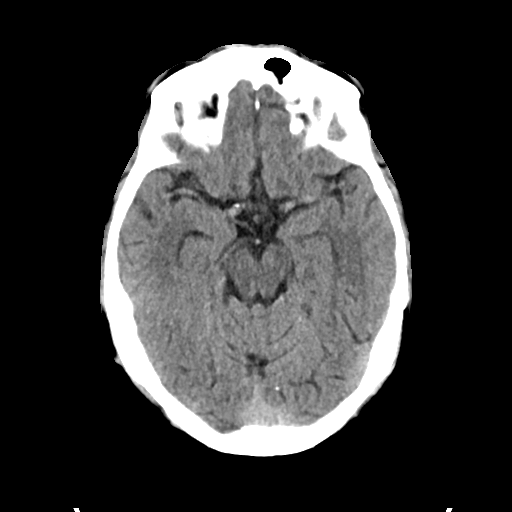
[im 16/32  brain]
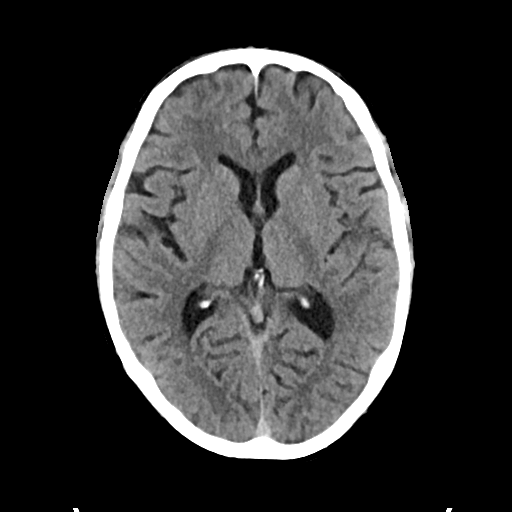
[im 20/32  brain]
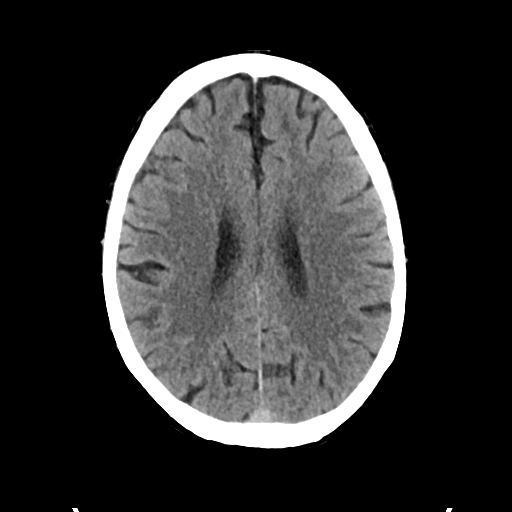
[im 20/32  bone]
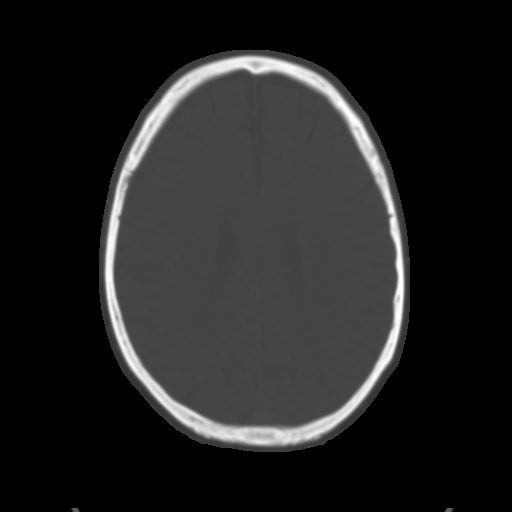
[im 24/32  brain]
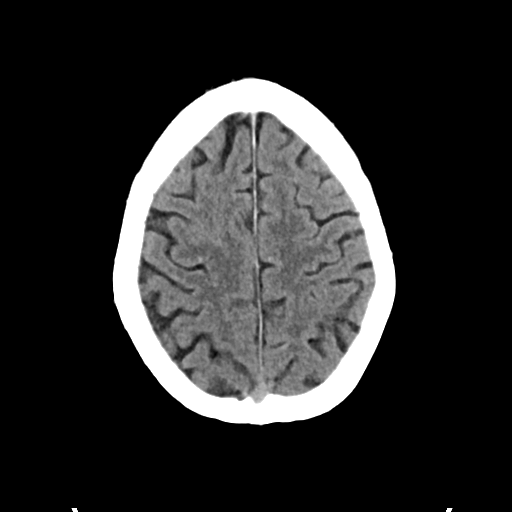
[im 28/32  brain]
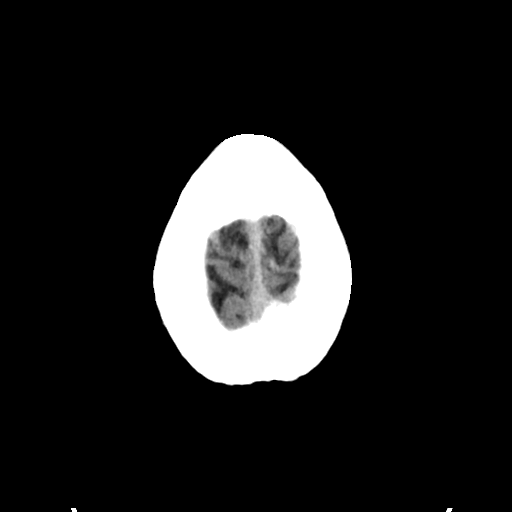

[Series 3: head bone · axial · 0.46mm/px · z∈[+1276,+1308]mm · 3 of 80 slices shown]
[im 8/80  bone]
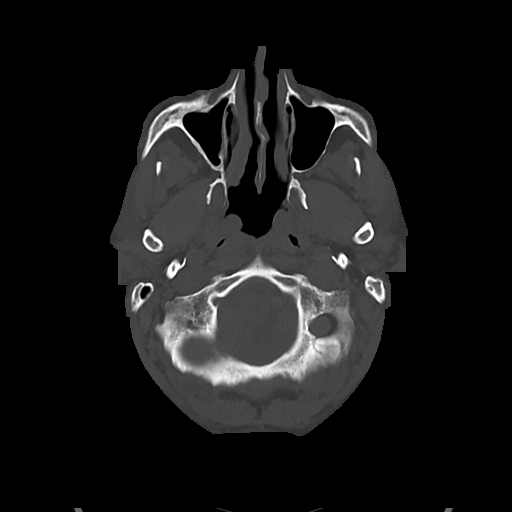
[im 16/80  bone]
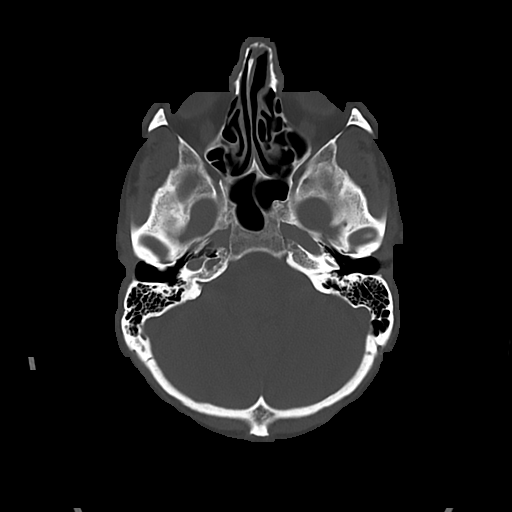
[im 24/80  bone]
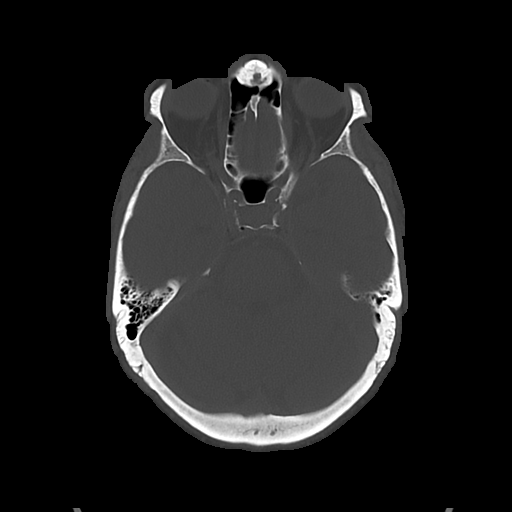

[Series 4: cor soft · coronal · 0.32mm/px · 3 of 73 slices shown]
[im 25/73  brain]
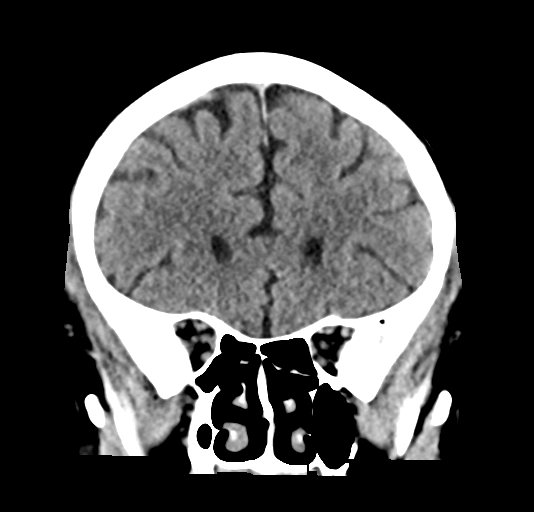
[im 33/73  brain]
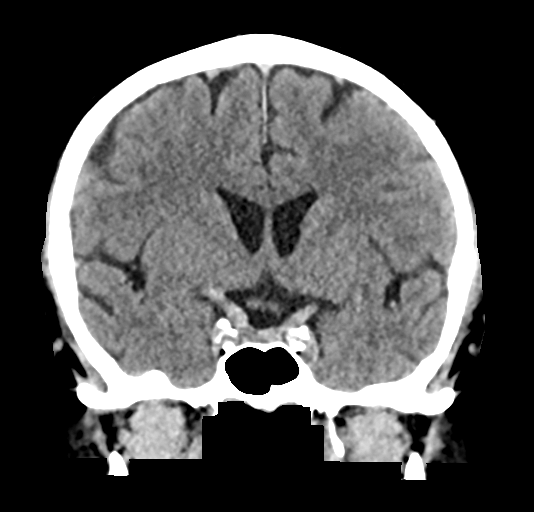
[im 41/73  brain]
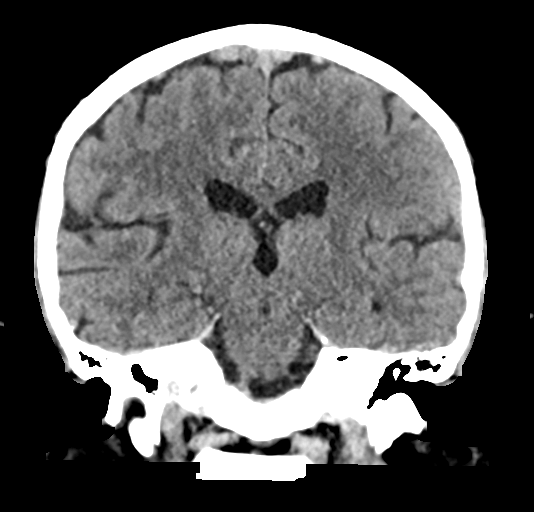

[Series 5: sag soft · sagittal · 0.32mm/px · 3 of 59 slices shown]
[im 20/59  brain]
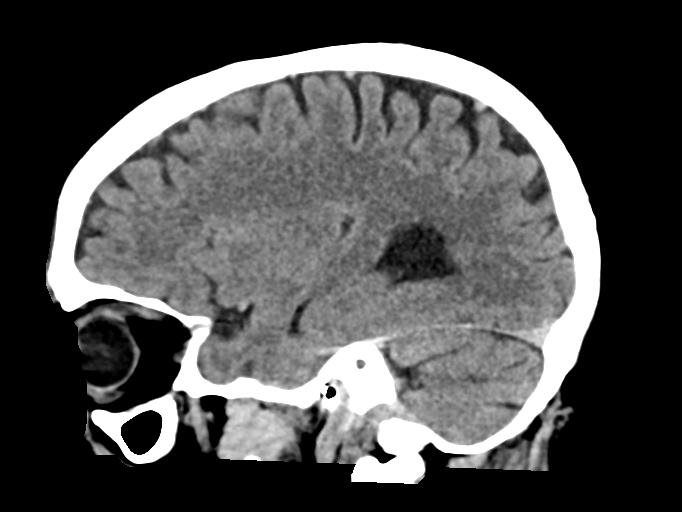
[im 30/59  brain]
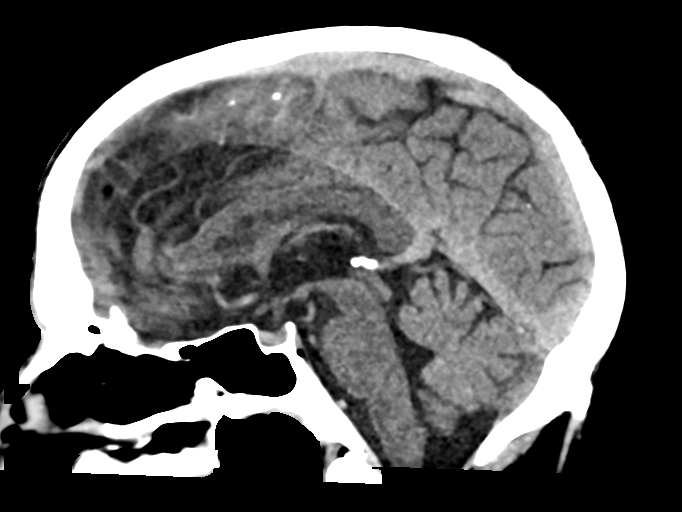
[im 39/59  brain]
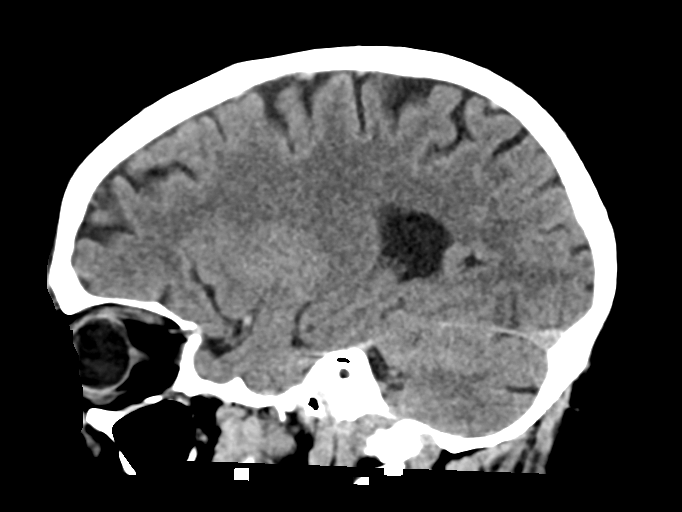

[16 of 47 positions shown; findings below may reference images not displayed]

FINDINGS: Brain: No intracranial hemorrhage, mass effect, or midline shift. No
hydrocephalus. The basilar cisterns are patent. No evidence of
territorial infarct or acute ischemia. No extra-axial or
intracranial fluid collection.

Vascular: Atherosclerosis of skullbase vasculature without
hyperdense vessel or abnormal calcification.

Skull: No fracture or focal lesion.

Sinuses/Orbits: Paranasal sinuses and mastoid air cells are clear.
The visualized orbits are unremarkable. Bilateral cataract
resection.

Other: Scattered air in the venous structures typically related to
IV catheter placement.
IMPRESSION: No acute intracranial abnormality.

## 2021-05-02 ENCOUNTER — Other Ambulatory Visit: Payer: Self-pay

## 2021-05-02 ENCOUNTER — Ambulatory Visit (INDEPENDENT_AMBULATORY_CARE_PROVIDER_SITE_OTHER): Payer: Medicare HMO | Admitting: Gastroenterology

## 2021-05-02 ENCOUNTER — Encounter: Payer: Self-pay | Admitting: Gastroenterology

## 2021-05-02 VITALS — BP 143/72 | HR 53 | Temp 98.3°F | Ht 70.0 in | Wt 178.0 lb

## 2021-05-02 DIAGNOSIS — Z720 Tobacco use: Secondary | ICD-10-CM | POA: Insufficient documentation

## 2021-05-02 DIAGNOSIS — K921 Melena: Secondary | ICD-10-CM

## 2021-05-02 DIAGNOSIS — Q231 Congenital insufficiency of aortic valve: Secondary | ICD-10-CM | POA: Insufficient documentation

## 2021-05-02 DIAGNOSIS — I10 Essential (primary) hypertension: Secondary | ICD-10-CM | POA: Insufficient documentation

## 2021-05-02 MED ORDER — GOLYTELY 236 G PO SOLR: 4000.0000 mL | Freq: Once | ORAL | 0 refills | Status: AC

## 2021-05-02 NOTE — Progress Notes (Signed)
Gastroenterology Consultation  Referring Provider:     Kandyce Rud, MD Primary Care Physician:  Kandyce Rud, MD Primary Gastroenterologist:  Dr. Servando Snare     Reason for Consultation:     BRBPR        HPI:   Ryan Weeks. is a 70 y.o. y/o male referred for consultation & management of rectal bleeding by Dr. Kandyce Rud, MD. This patient comes in today after seeing me in the past for rectal bleeding.  The patient saw me in the past for colonoscopy many years ago.  He reports that his last colonoscopy was over 10 years ago.  He has been doing well without any complaints.  He reports that he had some pink blood from his rectum.  There is no report of any change in bowel habits any diarrhea constipation or black stools.  He also denies any abdominal pain.  The patient most recent CBC showed a hemoglobin of 14.9 and hematocrit of 44.9.  This was done approximately 1 month ago. He did have an increased alkaline phosphatase of 161 with his other liver enzymes being normal and does report that he drinks a few beers a night.  Past Medical History:  Diagnosis Date   Hypertension     Past Surgical History:  Procedure Laterality Date   CARDIAC VALVE REPLACEMENT  2019    Prior to Admission medications   Medication Sig Start Date End Date Taking? Authorizing Provider  acetaminophen (TYLENOL) 500 MG tablet Take 500 mg by mouth every 6 (six) hours as needed (for headaches).   Yes [provider]  amLODipine (NORVASC) 10 MG tablet Take 10 mg by mouth daily. 03/06/21  Yes [provider]  aspirin 81 MG EC tablet Take by mouth.   Yes [provider]  cetirizine (ZYRTEC) 10 MG tablet Take 10 mg by mouth daily as needed (for seasonal allergies).   Yes [provider]  Cholecalciferol 25 MCG (1000 UT) capsule Take 1,000 Units by mouth in the morning.   Yes [provider]  citalopram (CELEXA) 20 MG tablet Take 20 mg by mouth daily. 04/27/21  Yes  [provider]  clopidogrel (PLAVIX) 75 MG tablet Take 75 mg by mouth daily. 03/27/21  Yes [provider]  escitalopram (LEXAPRO) 10 MG tablet Take 1 tablet by mouth daily. 04/25/21  Yes [provider]  losartan (COZAAR) 100 MG tablet Take 100 mg by mouth in the morning.  12/10/19  Yes [provider]  Magnesium 500 MG TABS Take 500 mg by mouth at bedtime.   Yes [provider]  metoprolol succinate (TOPROL-XL) 25 MG 24 hr tablet Take 25 mg by mouth daily. 10/28/19  Yes [provider]  pravastatin (PRAVACHOL) 80 MG tablet Take 1 tablet (80 mg total) by mouth at bedtime. 12/31/19  Yes Rhetta Mura, MD    No family history on file.   Social History   Tobacco Use   Smoking status: Former    Types: Cigarettes   Smokeless tobacco: Never   Tobacco comments:    Smoking for 40 years now  Substance Use Topics   Alcohol use: Yes    Alcohol/week: 2.0 standard drinks    Types: 2 Cans of beer per week    Comment: 2 or 3 beers on occasion    Allergies as of 05/02/2021   (No Known Allergies)    Review of Systems:    All systems reviewed and negative except where noted in HPI.  Physical Exam:  BP (!) 143/72 (BP Location: Right Arm, Patient Position: Sitting)   Pulse (!) 53   Temp 98.3 F (36.8 C)   Ht 5\' 10"  (1.778 m)   Wt 178 lb (80.7 kg)   BMI 25.54 kg/m  No LMP for male patient. General:   Alert,  Well-developed, well-nourished, pleasant and cooperative in NAD Head:  Normocephalic and atraumatic. Eyes:  Sclera clear, no icterus.   Conjunctiva pink. Ears:  Normal auditory acuity. Neck:  Supple; no masses or thyromegaly. Lungs:  Respirations even and unlabored.  Clear throughout to auscultation.   No wheezes, crackles, or rhonchi. No acute distress. Heart:  Regular rate and rhythm; no murmurs, clicks, rubs, or gallops. Abdomen:  Normal bowel sounds.  No bruits.  Soft, non-tender and non-distended without masses,  hepatosplenomegaly or hernias noted.  No guarding or rebound tenderness.  Negative Carnett sign.   Rectal:  Deferred.  Pulses:  Normal pulses noted. Extremities:  No clubbing or edema.  No cyanosis. Neurologic:  Alert and oriented x3;  grossly normal neurologically. Skin:  Intact without significant lesions or rashes.  No jaundice. Lymph Nodes:  No significant cervical adenopathy. Psych:  Alert and cooperative. Normal mood and affect.  Imaging Studies: No results found.  Assessment and Plan:   Ryan Weeks. is a 70 y.o. y/o male who comes in today with a history of not having a colonoscopy in over 10 years and a report of having some blood per rectum.  He states that it was pink in color.  The patient will be set up for colonoscopy due to his history of rectal bleeding.  The patient has been explained the plan and agrees with it.    79, MD. [name]    Note: This dictation was prepared with Dragon dictation along with smaller phrase technology. Any transcriptional errors that result from this process are unintentional.

## 2021-05-08 ENCOUNTER — Encounter: Payer: Self-pay | Admitting: Gastroenterology

## 2021-05-09 ENCOUNTER — Encounter: Payer: Self-pay | Admitting: Gastroenterology

## 2021-05-09 ENCOUNTER — Ambulatory Visit: Payer: Medicare HMO | Admitting: Anesthesiology

## 2021-05-09 ENCOUNTER — Ambulatory Visit
Admission: RE | Admit: 2021-05-09 | Discharge: 2021-05-09 | Disposition: A | Discharge status: Home / Self Care | Payer: Medicare HMO | Attending: Gastroenterology | Admitting: Gastroenterology

## 2021-05-09 ENCOUNTER — Encounter
Admission: RE | Disposition: A | Discharge status: Home / Self Care | Payer: Self-pay | Source: Home / Self Care | Attending: Gastroenterology

## 2021-05-09 DIAGNOSIS — Z7982 Long term (current) use of aspirin: Secondary | ICD-10-CM | POA: Diagnosis not present

## 2021-05-09 DIAGNOSIS — K921 Melena: Secondary | ICD-10-CM | POA: Insufficient documentation

## 2021-05-09 DIAGNOSIS — F17210 Nicotine dependence, cigarettes, uncomplicated: Secondary | ICD-10-CM | POA: Insufficient documentation

## 2021-05-09 DIAGNOSIS — Z7902 Long term (current) use of antithrombotics/antiplatelets: Secondary | ICD-10-CM | POA: Insufficient documentation

## 2021-05-09 DIAGNOSIS — K529 Noninfective gastroenteritis and colitis, unspecified: Secondary | ICD-10-CM | POA: Diagnosis not present

## 2021-05-09 DIAGNOSIS — Z79899 Other long term (current) drug therapy: Secondary | ICD-10-CM | POA: Insufficient documentation

## 2021-05-09 DIAGNOSIS — K648 Other hemorrhoids: Secondary | ICD-10-CM | POA: Insufficient documentation

## 2021-05-09 HISTORY — PX: COLONOSCOPY WITH PROPOFOL: SHX5780

## 2021-05-09 HISTORY — DX: Pure hypercholesterolemia, unspecified: E78.00

## 2021-05-09 SURGERY — COLONOSCOPY WITH PROPOFOL
Anesthesia: General

## 2021-05-09 MED ORDER — PROPOFOL 500 MG/50ML IV EMUL
INTRAVENOUS | Status: DC | PRN
  Administered 2021-05-09: 140 ug/kg/min via INTRAVENOUS

## 2021-05-09 MED ORDER — SODIUM CHLORIDE 0.9 % IV SOLN: INTRAVENOUS | Status: DC

## 2021-05-09 MED ORDER — PROPOFOL 10 MG/ML IV BOLUS
INTRAVENOUS | Status: DC | PRN
  Administered 2021-05-09: 70 mg via INTRAVENOUS

## 2021-05-09 NOTE — Anesthesia Preprocedure Evaluation (Signed)
Anesthesia Evaluation   Patient awake    Reviewed: Allergy & Precautions, NPO status , Patient's Chart, lab work & pertinent test results, reviewed documented beta blocker date and time   Airway Mallampati: II  TM Distance: >3 FB Neck ROM: Full    Dental no notable dental hx.    Pulmonary Current SmokerPatient did not abstain from smoking.,    Pulmonary exam normal breath sounds clear to auscultation       Cardiovascular Exercise Tolerance: Good hypertension, Pt. on medications and Pt. on home beta blockers + Peripheral Vascular Disease  Normal cardiovascular exam Rhythm:Regular Rate:Normal  S/P AVR   Neuro/Psych negative neurological ROS  negative psych ROS   GI/Hepatic Neg liver ROS, blood in stool   Endo/Other  negative endocrine ROS  Renal/GU negative Renal ROS     Musculoskeletal negative musculoskeletal ROS (+)   Abdominal Normal abdominal exam  (+)   Peds  Hematology negative hematology ROS (+)   Anesthesia Other Findings   Reproductive/Obstetrics negative OB ROS                            Anesthesia Physical Anesthesia Plan  ASA: 2  Anesthesia Plan: General   Post-op Pain Management:    Induction: Intravenous  PONV Risk Score and Plan: 1 and Propofol infusion and TIVA  Airway Management Planned: Natural Airway and Nasal Cannula  Additional Equipment:   Intra-op Plan:   Post-operative Plan:   Informed Consent: I have reviewed the patients History and Physical, chart, labs and discussed the procedure including the risks, benefits and alternatives for the proposed anesthesia with the patient or authorized representative who has indicated his/her understanding and acceptance.     Dental advisory given  Plan Discussed with: CRNA and Anesthesiologist  Anesthesia Plan Comments:         Anesthesia Quick Evaluation

## 2021-05-09 NOTE — Anesthesia Postprocedure Evaluation (Signed)
Anesthesia Post Note  Patient: Ryan Weeks.  Procedure(s) Performed: COLONOSCOPY WITH PROPOFOL  Patient location during evaluation: Endoscopy Anesthesia Type: General Level of consciousness: awake and alert Pain management: pain level controlled Vital Signs Assessment: post-procedure vital signs reviewed and stable Respiratory status: spontaneous breathing, nonlabored ventilation and respiratory function stable Cardiovascular status: blood pressure returned to baseline and stable Postop Assessment: no apparent nausea or vomiting Anesthetic complications: no   No notable events documented.   Last Vitals:  Vitals:   05/09/21 1015 05/09/21 1025  BP: (!) 144/72 (!) 168/64  Pulse: (!) 53 (!) 46  Resp: 13 18  Temp:    SpO2: 99% 98%    Last Pain:  Vitals:   05/09/21 1025  TempSrc:   PainSc: 0-No pain                 Foye Deer

## 2021-05-09 NOTE — Transfer of Care (Signed)
Immediate Anesthesia Transfer of Care Note  Patient: Ryan Weeks.  Procedure(s) Performed: COLONOSCOPY WITH PROPOFOL  Patient Location: PACU  Anesthesia Type:General  Level of Consciousness: sedated  Airway & Oxygen Therapy: Patient Spontanous Breathing  Post-op Assessment: Report given to RN and Post -op Vital signs reviewed and stable  Post vital signs: Reviewed and stable  Last Vitals:  Vitals Value Taken Time  BP 108/61 05/09/21 1005  Temp    Pulse 50 05/09/21 1010  Resp 17 05/09/21 1010  SpO2 98 % 05/09/21 1010  Vitals shown include unvalidated device data.  Last Pain:  Vitals:   05/09/21 1005  TempSrc:   PainSc: 0-No pain         Complications: No notable events documented.

## 2021-05-09 NOTE — H&P (Signed)
[name], MD [facility] [address], [address] [place], [place] [address] [phone] Fax : [phone]  Primary Care Physician:  Kandyce Rud, MD Primary Gastroenterologist:  Dr. Servando Snare  Pre-Procedure History & Physical: HPI:  Ryan Weeks. is a 70 y.o. male is here for an colonoscopy.   Past Medical History:  Diagnosis Date   Hypercholesteremia    Hypertension     Past Surgical History:  Procedure Laterality Date   CARDIAC VALVE REPLACEMENT  2019   LUMBAR DISC SURGERY      Prior to Admission medications   Medication Sig Start Date End Date Taking? Authorizing Provider  amLODipine (NORVASC) 10 MG tablet Take 10 mg by mouth daily. 03/06/21  Yes [provider]  aspirin 81 MG EC tablet Take by mouth.   Yes [provider]  cetirizine (ZYRTEC) 10 MG tablet Take 10 mg by mouth daily as needed (for seasonal allergies).   Yes [provider]  citalopram (CELEXA) 20 MG tablet Take 20 mg by mouth daily. 04/27/21  Yes [provider]  escitalopram (LEXAPRO) 10 MG tablet Take 1 tablet by mouth daily. 04/25/21  Yes [provider]  Magnesium 500 MG TABS Take 500 mg by mouth at bedtime.   Yes [provider]  metoprolol succinate (TOPROL-XL) 25 MG 24 hr tablet Take 25 mg by mouth daily. 10/28/19  Yes [provider]  pravastatin (PRAVACHOL) 80 MG tablet Take 1 tablet (80 mg total) by mouth at bedtime. 12/31/19  Yes Rhetta Mura, MD  acetaminophen (TYLENOL) 500 MG tablet Take 500 mg by mouth every 6 (six) hours as needed (for headaches).    [provider]  Cholecalciferol 25 MCG (1000 UT) capsule Take 1,000 Units by mouth in the morning.    [provider]  clopidogrel (PLAVIX) 75 MG tablet Take 75 mg by mouth daily. Patient not taking: Reported on 05/09/2021 03/27/21   [provider]  losartan (COZAAR) 100 MG tablet Take 100 mg by mouth in the morning.  Patient not taking: Reported on  05/09/2021 12/10/19   [provider]    Allergies as of 05/02/2021   (No Known Allergies)    History reviewed. No pertinent family history.  Social History   Socioeconomic History   Marital status: Married    Spouse name: Not on file   Number of children: Not on file   Years of education: Not on file   Highest education level: Not on file  Occupational History   Not on file  Tobacco Use   Smoking status: Every Day    Types: Cigarettes   Smokeless tobacco: Never   Tobacco comments:    Smoking for 40 years now  Vaping Use   Vaping Use: Never used  Substance and Sexual Activity   Alcohol use: Yes    Alcohol/week: 2.0 standard drinks    Types: 2 Cans of beer per week    Comment: 2 or 3 beers on occasion   Drug use: Never   Sexual activity: Not on file  Other Topics Concern   Not on file  Social History Narrative   Not on file   Social Determinants of Health   Financial Resource Strain: Not on file  Food Insecurity: Not on file  Transportation Needs: Not on file  Physical Activity: Not on file  Stress: Not on file  Social Connections: Not on file  Intimate Partner Violence: Not on file    Review of Systems: See HPI, otherwise negative  ROS  Physical Exam: There were no vitals taken for this visit. General:   Alert,  pleasant and cooperative in NAD Head:  Normocephalic and atraumatic. Neck:  Supple; no masses or thyromegaly. Lungs:  Clear throughout to auscultation.    Heart:  Regular rate and rhythm. Abdomen:  Soft, nontender and nondistended. Normal bowel sounds, without guarding, and without rebound.   Neurologic:  Alert and  oriented x4;  grossly normal neurologically.  Impression/Plan: Ryan Weeks. is here for an colonoscopy to be performed for rectal bleeding  Risks, benefits, limitations, and alternatives regarding  colonoscopy have been reviewed with the patient.  Questions have been answered.  All parties agreeable.   [name], MD   05/09/2021, 9:22 AM

## 2021-05-09 NOTE — Op Note (Signed)
[facility] Patient Name: Rayford Williamsen Procedure Date: 05/09/2021 9:14 AM MRN: [id number] Account #: [id number] Date of Birth: [birth year] Admit Type: Outpatient Age: 70 Room: [facility] ENDO ROOM 4 Gender: Male Note Status: Finalized Procedure:             Colonoscopy Indications:           Hematochezia Providers:             [name] MD, MD Medicines:             Propofol per Anesthesia Complications:         No immediate complications. Procedure:             Pre-Anesthesia Assessment:                        - Prior to the procedure, a History and Physical was                         performed, and patient medications and allergies were                         reviewed. The patient's tolerance of previous                         anesthesia was also reviewed. The risks and benefits                         of the procedure and the sedation options and risks                         were discussed with the patient. All questions were                         answered, and informed consent was obtained. Prior                         Anticoagulants: The patient has taken no previous                         anticoagulant or antiplatelet agents. ASA Grade                         Assessment: II - A patient with mild systemic disease.                         After reviewing the risks and benefits, the patient                         was deemed in satisfactory condition to undergo the                         procedure.                        After obtaining informed consent, the colonoscope was                         passed under direct vision. Throughout the procedure,  the patient's blood pressure, pulse, and oxygen                         saturations were monitored continuously. The                         Colonoscope was introduced through the anus and                         advanced to the the terminal ileum. The colonoscopy                          was performed without difficulty. The patient                         tolerated the procedure well. The quality of the bowel                         preparation was excellent. Findings:      The perianal and digital rectal examinations were normal.      Non-bleeding internal hemorrhoids were found during retroflexion. The       hemorrhoids were Grade II (internal hemorrhoids that prolapse but reduce       spontaneously).      The terminal ileum appeared normal. Biopsies were taken with a cold       forceps for histology.      The descending colon, splenic flexure, transverse colon, hepatic       flexure, ascending colon and cecum appeared normal. Random biopsies were       obtained with cold forceps for histology randomly.      Diffuse moderate inflammation characterized by erythema was found in the       sigmoid colon. Biopsies were taken with a cold forceps for histology.      Multiple small-mouthed diverticula were found in the sigmoid colon.       Biopsies were taken with a cold forceps for histology. Impression:            - Non-bleeding internal hemorrhoids.                        - The examined portion of the ileum was normal.                         Biopsied.                        - The descending colon, splenic flexure, transverse                         colon, hepatic flexure, ascending colon and cecum are                         normal.                        - Diffuse moderate inflammation was found in the                         sigmoid colon secondary to colitis. Biopsied.                        -  Diverticulosis in the sigmoid colon. Biopsied.                        - Random biopsies were obtained. Recommendation:        - Discharge patient to home.                        - Resume previous diet.                        - Continue present medications.                        - Await pathology results. Procedure Code(s):     --- Professional ---                         [phone], Colonoscopy, flexible; with biopsy, single or                         multiple Diagnosis Code(s):     --- Professional ---                        K92.1, Melena (includes Hematochezia)                        K52.9, Noninfective gastroenteritis and colitis,                         unspecified CPT copyright 2019 American Medical Association. All rights reserved. The codes documented in this report are preliminary and upon coder review may  be revised to meet current compliance requirements. [name] MD, MD 05/09/2021 10:02:58 AM This report has been signed electronically. Number of Addenda: 0 Note Initiated On: 05/09/2021 9:14 AM Scope Withdrawal Time: 0 hours 9 minutes 29 seconds  Total Procedure Duration: 0 hours 15 minutes 23 seconds  Estimated Blood Loss:  Estimated blood loss: none.      [facility]

## 2021-05-10 ENCOUNTER — Encounter: Payer: Self-pay | Admitting: Gastroenterology

## 2021-05-10 LAB — SURGICAL PATHOLOGY

## 2021-06-05 ENCOUNTER — Encounter: Payer: Self-pay | Admitting: Gastroenterology

## 2023-05-20 ENCOUNTER — Other Ambulatory Visit: Payer: Self-pay | Admitting: Internal Medicine

## 2023-05-20 DIAGNOSIS — I739 Peripheral vascular disease, unspecified: Secondary | ICD-10-CM

## 2023-05-23 ENCOUNTER — Ambulatory Visit
Admission: RE | Admit: 2023-05-23 | Discharge: 2023-05-23 | Disposition: A | Discharge status: Home / Self Care | Payer: Medicare HMO | Source: Ambulatory Visit | Attending: Internal Medicine | Admitting: Internal Medicine

## 2023-05-23 DIAGNOSIS — I739 Peripheral vascular disease, unspecified: Secondary | ICD-10-CM | POA: Insufficient documentation

## 2023-07-10 NOTE — H&P (Signed)
TOTAL KNEE ADMISSION H&P  Patient is being admitted for right total knee arthroplasty.  Subjective:  Chief Complaint: Right knee pain.  HPI: Ryan Weeks., 72 y.o. male has a history of pain and functional disability in the right knee due to arthritis and has failed non-surgical conservative treatments for greater than 12 weeks to include corticosteriod injections, viscosupplementation injections, and activity modification. Onset of symptoms was gradual, starting 5 years ago with gradually worsening course since that time. The patient noted no past surgery on the right knee.  Patient currently rates pain in the right knee at 8 out of 10 with activity. Patient has worsening of pain with activity and weight bearing and pain that interferes with activities of daily living. Patient has evidence of periarticular osteophytes and joint space narrowing by imaging studies. There is no active infection.  Patient Active Problem List   Diagnosis Date Noted   Blood in stool    Noninfectious colitis    Bicuspid aortic valve 05/02/2021   Hypertension 05/02/2021   Tobacco abuse 05/02/2021   Diplopia 12/30/2019   Hypertensive urgency 12/30/2019   Hyperlipidemia 12/30/2019   Chronic anticoagulation 06/23/2018   Acute postoperative pain 06/15/2018   Paroxysmal atrial fibrillation (HCC) 06/15/2018   S/P AVR (aortic valve replacement) 06/15/2018   Nonrheumatic aortic valve stenosis 06/09/2018   PAD (peripheral artery disease) (HCC) 06/06/2018    Past Medical History:  Diagnosis Date   Hypercholesteremia    Hypertension     Past Surgical History:  Procedure Laterality Date   CARDIAC VALVE REPLACEMENT  2019   COLONOSCOPY WITH PROPOFOL N/A 05/09/2021   Procedure: COLONOSCOPY WITH PROPOFOL;  Surgeon: [name], MD;  [facility] ENDOSCOPY;  Service: Endoscopy;  Laterality: N/A;   LUMBAR DISC SURGERY      Prior to Admission medications   Medication Sig Start Date End Date Taking? Authorizing  Provider  acetaminophen (TYLENOL) 500 MG tablet Take 500 mg by mouth every 6 (six) hours as needed (for headaches).    [provider]  amLODipine (NORVASC) 10 MG tablet Take 10 mg by mouth daily. 03/06/21   [provider]  aspirin 81 MG EC tablet Take by mouth.    [provider]  cetirizine (ZYRTEC) 10 MG tablet Take 10 mg by mouth daily as needed (for seasonal allergies).    [provider]  Cholecalciferol 25 MCG (1000 UT) capsule Take 1,000 Units by mouth in the morning.    [provider]  citalopram (CELEXA) 20 MG tablet Take 20 mg by mouth daily. 04/27/21   [provider]  clopidogrel (PLAVIX) 75 MG tablet Take 75 mg by mouth daily. Patient not taking: Reported on 05/09/2021 03/27/21   [provider]  escitalopram (LEXAPRO) 10 MG tablet Take 1 tablet by mouth daily. 04/25/21   [provider]  losartan (COZAAR) 100 MG tablet Take 100 mg by mouth in the morning.  Patient not taking: Reported on 05/09/2021 12/10/19   [provider]  Magnesium 500 MG TABS Take 500 mg by mouth at bedtime.    [provider]  metoprolol succinate (TOPROL-XL) 25 MG 24 hr tablet Take 25 mg by mouth daily. 10/28/19   [provider]  pravastatin (PRAVACHOL) 80 MG tablet Take 1 tablet (80 mg total) by mouth at bedtime. 12/31/19   Rhetta Mura, MD    No Known Allergies  Social History   Socioeconomic History   Marital status: Widowed    Spouse name: Not on file  Number of children: Not on file   Years of education: Not on file   Highest education level: Not on file  Occupational History   Not on file  Tobacco Use   Smoking status: Every Day    Types: Cigarettes   Smokeless tobacco: Never   Tobacco comments:    Smoking for 40 years now  Vaping Use   Vaping status: Never Used  Substance and Sexual Activity   Alcohol use: Yes    Alcohol/week: 2.0 standard drinks of alcohol    Types: 2 Cans of beer per  week    Comment: 2 or 3 beers on occasion   Drug use: Never   Sexual activity: Not on file  Other Topics Concern   Not on file  Social History Narrative   Not on file   Social Determinants of Health   Financial Resource Strain: Low Risk  (10/22/2022)   Received from [facility]   Overall Financial Resource Strain (CARDIA)    Difficulty of Paying Living Expenses: Not hard at all  Food Insecurity: No Food Insecurity (10/22/2022)   Received from [facility]   Hunger Vital Sign    Worried About Running Out of Food in the Last Year: Never true    Ran Out of Food in the Last Year: Never true  Transportation Needs: No Transportation Needs (10/22/2022)   Received from [facility] - Transportation    In the past 12 months, has lack of transportation kept you from medical appointments or from getting medications?: No    Lack of Transportation (Non-Medical): No  Physical Activity: Not on file  Stress: Not on file  Social Connections: Not on file  Intimate Partner Violence: Not on file    Tobacco Use: Medium Risk (06/27/2023)   Received from [facility]   Patient History    Smoking Tobacco Use: Former    Smokeless Tobacco Use: Never    Passive Exposure: Not on file   Social History   Substance and Sexual Activity  Alcohol Use Yes   Alcohol/week: 2.0 standard drinks of alcohol   Types: 2 Cans of beer per week   Comment: 2 or 3 beers on occasion    No family history on file.  ROS  Objective:  Physical Exam: - Well-developed male, alert and oriented, no apparent distress.    - Left knee shows no effusion. Range of motion is about 0 to 125 with crepitus on range of motion. There is no joint line tenderness or instability.    - Right hip shows normal range of motion with no discomfort.    - Right knee shows no effusion. Range of motion is about 5 to 120 with crepitus on range of motion. He is  tender medial and lateral with no instability noted. There is a Baker's cyst on the right. His gait pattern is antalgic on the right.   IMAGING:   Radiographs (AP and lateral) of both knees taken today demonstrate bone-on-bone medial and patellofemoral compartments of the right knee. The left knee also shows bone-on-bone medial   Assessment/Plan:  End stage arthritis, right knee   The patient history, physical examination, clinical judgment of the provider and imaging studies are consistent with end stage degenerative joint disease of the right knee and total knee arthroplasty is deemed medically necessary. The treatment options including medical management, injection therapy arthroscopy and arthroplasty were discussed at length. The risks and benefits of  total knee arthroplasty were presented and reviewed. The risks due to aseptic loosening, infection, stiffness, patella tracking problems, thromboembolic complications and other imponderables were discussed. The patient acknowledged the explanation, agreed to proceed with the plan and consent was signed. Patient is being admitted for inpatient treatment for surgery, pain control, PT, OT, prophylactic antibiotics, VTE prophylaxis, progressive ambulation and ADLs and discharge planning. The patient is planning to be discharged home.   Patient's anticipated LOS is less than 2 midnights, meeting these requirements: - Younger than 5 - Lives within 1 hour of care - Has a competent adult at home to recover with post-op recover - NO history of  - Chronic pain requiring opiods  - Diabetes  - Coronary Artery Disease  - Heart failure  - Heart attack  - Stroke  - DVT/VTE  - Cardiac arrhythmia  - Respiratory Failure/COPD  - Renal failure  - Anemia  - Advanced Liver disease  Therapy Plans: Roseanne Reno [facility] [place] Disposition: Home with son Planned DVT Prophylaxis: ASA 81mg  BID DME Needed: None PCP: Kandyce Rud, MD (clearance  pending) Cardiologist: Dr Clyde Canterbury (clearance pending) TXA: IV Allergies: NKDA Anesthesia Concerns: None BMI: 25.8 Last HgbA1c: Not diabetic [facility]acy: [facility] [address], [place]  - Patient was instructed on what medications to stop prior to surgery. - Follow-up visit in 2 weeks with Dr. Lequita Halt - Begin physical therapy following surgery - Pre-operative lab work as pre-surgical testing - Prescriptions will be provided in hospital at time of discharge  Weston Brass, PA-C Orthopedic Surgery [facility] [place]

## 2023-07-24 NOTE — Progress Notes (Addendum)
Anesthesia Review:  PCP: DR Orvil Feil clearance on chart dated 07/12/23  LOV 05/24/23.   Cardiologist : DR Melton Alar- Clearance dated 07/15/23 on chart  LOV 06/27/23  Chest x-ray : EKG :06/27/23 - have requested tracing from DR Custovic.  By fax.  Echo : 06/03/23  Stress test: Cardiac Cath :  Activity level: can do a  flight of stairs without difficulty  Sleep Study/ CPAP : none  Fasting Blood Sugar :      / Checks Blood Sugar -- times a day:   Blood Thinner/ Instructions /Last Dose: ASA / Instructions/ Last Dose :

## 2023-07-24 NOTE — Patient Instructions (Addendum)
SURGICAL WAITING ROOM VISITATION  Patients having surgery or a procedure may have no more than 2 support people in the waiting area - these visitors may rotate.    Children under the age of 67 must have an adult with them who is not the patient.  Due to an increase in RSV and influenza rates and associated hospitalizations, children ages 30 and under may not visit patients in [facility] hospitals.  If the patient needs to stay at the hospital during part of their recovery, the visitor guidelines for inpatient rooms apply. Pre-op nurse will coordinate an appropriate time for 1 support person to accompany patient in pre-op.  This support person may not rotate.    Please refer to the [facility] website for the visitor guidelines for Inpatients (after your surgery is over and you are in a regular room).       Your procedure is scheduled on:  08/05/2023    Report to [facility] Main Entrance    Report to admitting at   0900AM   Call this number if you have problems the morning of surgery [phone]   Do not eat food :After Midnight.   After Midnight you may have the following liquids until _ 0830_____ AM DAY OF SURGERY  Water Non-Citrus Juices (without pulp, NO RED-Apple, White grape, White cranberry) Black Coffee (NO MILK/CREAM OR CREAMERS, sugar ok)  Clear Tea (NO MILK/CREAM OR CREAMERS, sugar ok) regular and decaf                             Plain Jell-O (NO RED)                                           Fruit ices (not with fruit pulp, NO RED)                                     Popsicles (NO RED)                                                               Sports drinks like Gatorade (NO RED)                    The day of surgery:  Drink ONE (1) Pre-Surgery Clear Ensure or G2 at 0830 AM ( have completed by )  the morning of surgery. Drink in one sitting. Do not sip.  This drink was given to you during your hospital  pre-op appointment visit. Nothing else to  drink after completing the  Pre-Surgery Clear Ensure or G2.          If you have questions, please contact your surgeon's office.  !     Oral Hygiene is also important to reduce your risk of infection.                                    Remember - BRUSH YOUR TEETH THE MORNING OF SURGERY WITH YOUR  REGULAR TOOTHPASTE  DENTURES WILL BE REMOVED PRIOR TO SURGERY PLEASE DO NOT APPLY "Poly grip" OR ADHESIVES!!!   Do NOT smoke after Midnight   Stop all vitamins and herbal supplements 7 days before surgery.   Take these medicines the morning of surgery with A SIP OF WATER:   inhalers as usual and bring, celexa, toprol   DO NOT TAKE ANY ORAL DIABETIC MEDICATIONS DAY OF YOUR SURGERY  Bring CPAP mask and tubing day of surgery.                              You may not have any metal on your body including hair pins, jewelry, and body piercing             Do not wear make-up, lotions, powders, perfumes/cologne, or deodorant  Do not wear nail polish including gel and S&S, artificial/acrylic nails, or any other type of covering on natural nails including finger and toenails. If you have artificial nails, gel coating, etc. that needs to be removed by a nail salon please have this removed prior to surgery or surgery may need to be canceled/ delayed if the surgeon/ anesthesia feels like they are unable to be safely monitored.   Do not shave  48 hours prior to surgery.               Men may shave face and neck.   Do not bring valuables to the hospital. [facility] IS NOT             RESPONSIBLE   FOR VALUABLES.   Contacts, glasses, dentures or bridgework may not be worn into surgery.   Bring small overnight bag day of surgery.   DO NOT BRING YOUR HOME MEDICATIONS TO THE HOSPITAL. PHARMACY WILL DISPENSE MEDICATIONS LISTED ON YOUR MEDICATION LIST TO YOU DURING YOUR ADMISSION IN THE HOSPITAL!    Patients discharged on the day of surgery will not be allowed to drive home.  Someone NEEDS to stay  with you for the first 24 hours after anesthesia.   Special Instructions: Bring a copy of your healthcare power of attorney and living will documents the day of surgery if you haven't scanned them before.              Please read over the following fact sheets you were given: IF YOU HAVE QUESTIONS ABOUT YOUR PRE-OP INSTRUCTIONS PLEASE CALL [phone]   If you received a COVID test during your pre-op visit  it is requested that you wear a mask when out in public, stay away from anyone that may not be feeling well and notify your surgeon if you develop symptoms. If you test positive for Covid or have been in contact with anyone that has tested positive in the last 10 days please notify you surgeon.      Pre-operative 5 CHG Bath Instructions   You can play a key role in reducing the risk of infection after surgery. Your skin needs to be as free of germs as possible. You can reduce the number of germs on your skin by washing with CHG (chlorhexidine gluconate) soap before surgery. CHG is an antiseptic soap that kills germs and continues to kill germs even after washing.   DO NOT use if you have an allergy to chlorhexidine/CHG or antibacterial soaps. If your skin becomes reddened or irritated, stop using the CHG and notify one of our RNs at [phone].   Please  shower with the CHG soap starting 4 days before surgery using the following schedule:     Please keep in mind the following:  DO NOT shave, including legs and underarms, starting the day of your first shower.   You may shave your face at any point before/day of surgery.  Place clean sheets on your bed the day you start using CHG soap. Use a clean washcloth (not used since being washed) for each shower. DO NOT sleep with pets once you start using the CHG.   CHG Shower Instructions:  If you choose to wash your hair and private area, wash first with your normal shampoo/soap.  After you use shampoo/soap, rinse your hair and body  thoroughly to remove shampoo/soap residue.  Turn the water OFF and apply about 3 tablespoons (45 ml) of CHG soap to a CLEAN washcloth.  Apply CHG soap ONLY FROM YOUR NECK DOWN TO YOUR TOES (washing for 3-5 minutes)  DO NOT use CHG soap on face, private areas, open wounds, or sores.  Pay special attention to the area where your surgery is being performed.  If you are having back surgery, having someone wash your back for you may be helpful. Wait 2 minutes after CHG soap is applied, then you may rinse off the CHG soap.  Pat dry with a clean towel  Put on clean clothes/pajamas   If you choose to wear lotion, please use ONLY the CHG-compatible lotions on the back of this paper.     Additional instructions for the day of surgery: DO NOT APPLY any lotions, deodorants, cologne, or perfumes.   Put on clean/comfortable clothes.  Brush your teeth.  Ask your nurse before applying any prescription medications to the skin.      CHG Compatible Lotions   Aveeno Moisturizing lotion  Cetaphil Moisturizing Cream  Cetaphil Moisturizing Lotion  Clairol Herbal Essence Moisturizing Lotion, Dry Skin  Clairol Herbal Essence Moisturizing Lotion, Extra Dry Skin  Clairol Herbal Essence Moisturizing Lotion, Normal Skin  Curel Age Defying Therapeutic Moisturizing Lotion with Alpha Hydroxy  Curel Extreme Care Body Lotion  Curel Soothing Hands Moisturizing Hand Lotion  Curel Therapeutic Moisturizing Cream, Fragrance-Free  Curel Therapeutic Moisturizing Lotion, Fragrance-Free  Curel Therapeutic Moisturizing Lotion, Original Formula  Eucerin Daily Replenishing Lotion  Eucerin Dry Skin Therapy Plus Alpha Hydroxy Crme  Eucerin Dry Skin Therapy Plus Alpha Hydroxy Lotion  Eucerin Original Crme  Eucerin Original Lotion  Eucerin Plus Crme Eucerin Plus Lotion  Eucerin TriLipid Replenishing Lotion  Keri Anti-Bacterial Hand Lotion  Keri Deep Conditioning Original Lotion Dry Skin Formula Softly Scented  Keri  Deep Conditioning Original Lotion, Fragrance Free Sensitive Skin Formula  Keri Lotion Fast Absorbing Fragrance Free Sensitive Skin Formula  Keri Lotion Fast Absorbing Softly Scented Dry Skin Formula  Keri Original Lotion  Keri Skin Renewal Lotion Keri Silky Smooth Lotion  Keri Silky Smooth Sensitive Skin Lotion  Nivea Body Creamy Conditioning Oil  Nivea Body Extra Enriched Teacher, adult education Moisturizing Lotion Nivea Crme  Nivea Skin Firming Lotion  NutraDerm 30 Skin Lotion  NutraDerm Skin Lotion  NutraDerm Therapeutic Skin Cream  NutraDerm Therapeutic Skin Lotion  ProShield Protective Hand Cream  Provon moisturizing lotion

## 2023-07-29 ENCOUNTER — Encounter (HOSPITAL_COMMUNITY): Payer: Self-pay

## 2023-07-29 ENCOUNTER — Other Ambulatory Visit: Payer: Self-pay

## 2023-07-29 ENCOUNTER — Encounter (HOSPITAL_COMMUNITY)
Admission: RE | Admit: 2023-07-29 | Discharge: 2023-07-29 | Disposition: A | Discharge status: Home / Self Care | Payer: Medicare HMO | Source: Ambulatory Visit | Attending: Orthopedic Surgery | Admitting: Orthopedic Surgery

## 2023-07-29 VITALS — BP 111/52 | HR 51 | Temp 97.5°F | Resp 16 | Ht 69.0 in | Wt 178.0 lb

## 2023-07-29 DIAGNOSIS — I10 Essential (primary) hypertension: Secondary | ICD-10-CM | POA: Insufficient documentation

## 2023-07-29 DIAGNOSIS — F17200 Nicotine dependence, unspecified, uncomplicated: Secondary | ICD-10-CM | POA: Insufficient documentation

## 2023-07-29 DIAGNOSIS — M1711 Unilateral primary osteoarthritis, right knee: Secondary | ICD-10-CM | POA: Diagnosis not present

## 2023-07-29 DIAGNOSIS — Z01812 Encounter for preprocedural laboratory examination: Secondary | ICD-10-CM | POA: Insufficient documentation

## 2023-07-29 DIAGNOSIS — Z01818 Encounter for other preprocedural examination: Secondary | ICD-10-CM

## 2023-07-29 DIAGNOSIS — J449 Chronic obstructive pulmonary disease, unspecified: Secondary | ICD-10-CM | POA: Insufficient documentation

## 2023-07-29 HISTORY — DX: Chronic obstructive pulmonary disease, unspecified: J44.9

## 2023-07-29 HISTORY — DX: Gastro-esophageal reflux disease without esophagitis: K21.9

## 2023-07-29 HISTORY — DX: Dyspnea, unspecified: R06.00

## 2023-07-29 HISTORY — DX: Prediabetes: R73.03

## 2023-07-29 HISTORY — DX: Unspecified osteoarthritis, unspecified site: M19.90

## 2023-07-29 LAB — BASIC METABOLIC PANEL
Anion gap: 9 (ref 5–15)
BUN: 22 mg/dL (ref 8–23)
CO2: 29 mmol/L (ref 22–32)
Calcium: 8.9 mg/dL (ref 8.9–10.3)
Chloride: 96 mmol/L — ABNORMAL LOW (ref 98–111)
Creatinine, Ser: 0.71 mg/dL (ref 0.61–1.24)
GFR, Estimated: >60 mL/min (ref >60)
Glucose, Bld: 108 mg/dL — ABNORMAL HIGH (ref 70–99)
Potassium: 4.6 mmol/L (ref 3.5–5.1)
Sodium: 134 mmol/L — ABNORMAL LOW (ref 135–145)

## 2023-07-29 LAB — SURGICAL PCR SCREEN
MRSA, PCR: NEGATIVE
Staphylococcus aureus: NEGATIVE

## 2023-07-29 LAB — CBC
HCT: 37.2 % — ABNORMAL LOW (ref 39.0–52.0)
Hemoglobin: 12.4 g/dL — ABNORMAL LOW (ref 13.0–17.0)
MCH: 32.6 pg (ref 26.0–34.0)
MCHC: 33.3 g/dL (ref 30.0–36.0)
MCV: 97.9 fL (ref 80.0–100.0)
Platelets: 177 10*3/uL (ref 150–400)
RBC: 3.8 MIL/uL — ABNORMAL LOW (ref 4.22–5.81)
RDW: 15.2 % (ref 11.5–15.5)
WBC: 5.9 10*3/uL (ref 4.0–10.5)
nRBC: 0 % (ref 0.0–0.2)

## 2023-07-30 ENCOUNTER — Encounter (HOSPITAL_COMMUNITY): Payer: Self-pay | Admitting: Physician Assistant

## 2023-07-30 NOTE — Anesthesia Preprocedure Evaluation (Signed)
Anesthesia Evaluation    Airway        Dental   Pulmonary Current Smoker          Cardiovascular hypertension,      Neuro/Psych    GI/Hepatic   Endo/Other    Renal/GU      Musculoskeletal   Abdominal   Peds  Hematology   Anesthesia Other Findings   Reproductive/Obstetrics                             Anesthesia Physical Anesthesia Plan  ASA:   Anesthesia Plan:    Post-op Pain Management:    Induction:   PONV Risk Score and Plan:   Airway Management Planned:   Additional Equipment:   Intra-op Plan:   Post-operative Plan:   Informed Consent:   Plan Discussed with:   Anesthesia Plan Comments: (See PAT note 07/29/2023)       Anesthesia Quick Evaluation

## 2023-07-30 NOTE — Progress Notes (Signed)
Anesthesia Chart Review   Case: [id number] Date/Time: 08/05/23 1120   Procedure: TOTAL KNEE ARTHROPLASTY (Right: Knee)   Anesthesia type: Choice   Pre-op diagnosis: Right knee osteoarthritis   Location: [facility] 09 / [facility]   Surgeons: Ollen Gross, MD       DISCUSSION:72 y.o. smoker with h/o HTN, COPD, s/p AVR 2019, postoperative a-fib with no recurrence, right knee OA scheduled for above procedure 08/05/23 with Dr. Ollen Gross.   Pt last seen by cardiology 06/27/2023. Per OV note pt euvolemic, stable at this visit.   Echo 06/03/2023 with EF 45%, mild AR, MR, TR, PR, bioprosthetic valve fully mobile.   Cardiology clearance on chart which states pt is cleared for surgery at moderate risk.   PCP clearance on chart as well which states pt is cleared at moderate risk.  VS: BP (!) 111/52   Pulse (!) 51   Temp (!) 36.4 C (Oral)   Resp 16   Ht 5\' 9"  (1.753 m)   Wt 80.7 kg   SpO2 98%   BMI 26.29 kg/m   PROVIDERS: Kandyce Rud, MD is PCP   Melton Alar Rozell Searing, DO is Cardiologist  LABS: Labs reviewed: Acceptable for surgery. (all labs ordered are listed, but only abnormal results are displayed)  Labs Reviewed  CBC - Abnormal; Notable for the following components:      Result Value   RBC 3.80 (*)    Hemoglobin 12.4 (*)    HCT 37.2 (*)    All other components within normal limits  BASIC METABOLIC PANEL - Abnormal; Notable for the following components:   Sodium 134 (*)    Chloride 96 (*)    Glucose, Bld 108 (*)    All other components within normal limits  SURGICAL PCR SCREEN     IMAGES:   EKG:   CV: Echo 06/03/2023 INTERPRETATION  MILD LV SYSTOLIC DYSFUNCTION (See above)  MODERATE RV SYSTOLIC DYSFUNCTION (See above)  MILD VALVULAR REGURGITATION (See above)  LA MODERATELY ENLARGED  MODERATE PLEURAL EFFUSION  MILD PERICARDIAL EFFUSION  RA MILDLY ENLARGED  MILD AR, MR, TR, PR  s/p bioprosthetic AVR  EF 45%  Past Medical History:  Diagnosis Date   Arthritis     COPD (chronic obstructive pulmonary disease) (HCC)    Dyspnea    with exertion   GERD (gastroesophageal reflux disease)    Hypercholesteremia    Hypertension    Pre-diabetes     Past Surgical History:  Procedure Laterality Date   CARDIAC VALVE REPLACEMENT  2019   COLONOSCOPY WITH PROPOFOL N/A 05/09/2021   Procedure: COLONOSCOPY WITH PROPOFOL;  Surgeon: [name], MD;  Location: [facility] ENDOSCOPY;  Service: Endoscopy;  Laterality: N/A;   LUMBAR DISC SURGERY      MEDICATIONS:  acetaminophen (TYLENOL) 500 MG tablet   albuterol (VENTOLIN HFA) 108 (90 Base) MCG/ACT inhaler   bumetanide (BUMEX) 2 MG tablet   citalopram (CELEXA) 20 MG tablet   losartan (COZAAR) 100 MG tablet   Magnesium 500 MG TABS   metoprolol succinate (TOPROL-XL) 25 MG 24 hr tablet   pravastatin (PRAVACHOL) 80 MG tablet   No current facility-administered medications for this encounter.     Jodell Cipro Ward, PA-C [facility] Pre-Surgical Testing [phone]

## 2023-08-05 ENCOUNTER — Ambulatory Visit (HOSPITAL_COMMUNITY)
Admission: RE | Admit: 2023-08-05 | Discharge: 2023-08-05 | Disposition: A | Discharge status: Home / Self Care | Payer: Medicare HMO | Attending: Orthopedic Surgery | Admitting: Orthopedic Surgery

## 2023-08-05 ENCOUNTER — Encounter (HOSPITAL_COMMUNITY)
Admission: RE | Disposition: A | Discharge status: Home / Self Care | Payer: Self-pay | Source: Home / Self Care | Attending: Orthopedic Surgery

## 2023-08-05 DIAGNOSIS — Z538 Procedure and treatment not carried out for other reasons: Secondary | ICD-10-CM | POA: Insufficient documentation

## 2023-08-05 DIAGNOSIS — M179 Osteoarthritis of knee, unspecified: Secondary | ICD-10-CM | POA: Diagnosis present

## 2023-08-05 DIAGNOSIS — M1711 Unilateral primary osteoarthritis, right knee: Secondary | ICD-10-CM | POA: Diagnosis present

## 2023-08-05 DIAGNOSIS — Z01818 Encounter for other preprocedural examination: Secondary | ICD-10-CM

## 2023-08-05 SURGERY — ARTHROPLASTY, KNEE, TOTAL
Anesthesia: Choice | Site: Knee | Laterality: Right

## 2023-08-05 MED ORDER — BUPIVACAINE LIPOSOME 1.3 % IJ SUSP: 20.0000 mL | Freq: Once | INTRAMUSCULAR | Status: DC

## 2023-08-05 MED ORDER — DEXAMETHASONE SODIUM PHOSPHATE 10 MG/ML IJ SOLN: 8.0000 mg | Freq: Once | INTRAMUSCULAR | Status: DC

## 2023-08-05 MED ORDER — CEFAZOLIN SODIUM-DEXTROSE 2-4 GM/100ML-% IV SOLN: 2.0000 g | INTRAVENOUS | Status: DC

## 2023-08-05 MED ORDER — ACETAMINOPHEN 10 MG/ML IV SOLN: 1000.0000 mg | Freq: Four times a day (QID) | INTRAVENOUS | Status: DC

## 2023-08-05 MED ORDER — LACTATED RINGERS IV SOLN: INTRAVENOUS | Status: DC

## 2023-08-05 MED ORDER — POVIDONE-IODINE 10 % EX SWAB: 2.0000 | Freq: Once | CUTANEOUS | Status: DC

## 2023-08-05 MED ORDER — ORAL CARE MOUTH RINSE: 15.0000 mL | Freq: Once | OROMUCOSAL | Status: DC

## 2023-08-05 MED ORDER — TRANEXAMIC ACID-NACL 1000-0.7 MG/100ML-% IV SOLN: 1000.0000 mg | INTRAVENOUS | Status: DC

## 2023-08-05 MED ORDER — MIDAZOLAM HCL 2 MG/2ML IJ SOLN: 2.0000 mg | INTRAMUSCULAR | Status: DC

## 2023-08-05 MED ORDER — FENTANYL CITRATE PF 50 MCG/ML IJ SOSY: 100.0000 ug | PREFILLED_SYRINGE | INTRAMUSCULAR | Status: DC

## 2023-08-05 MED ORDER — CHLORHEXIDINE GLUCONATE 0.12 % MT SOLN: 15.0000 mL | Freq: Once | OROMUCOSAL | Status: DC

## 2023-08-05 NOTE — Interval H&P Note (Signed)
History and Physical Interval Note:  08/05/2023 9:34 AM  Ryan Weeks.  has presented today for surgery, with the diagnosis of Right knee osteoarthritis.  The various methods of treatment have been discussed with the patient and family. After consideration of risks, benefits and other options for treatment, the patient has consented to  Procedure(s): TOTAL KNEE ARTHROPLASTY (Right) as a surgical intervention.  The patient's history has been reviewed, patient examined, no change in status, stable for surgery.  I have reviewed the patient's chart and labs.  Questions were answered to the patient's satisfaction.     Homero Fellers Maesyn Frisinger

## 2023-08-05 NOTE — Discharge Instructions (Signed)
Ollen Gross, MD Total Joint Specialist [facility] [address] [place], [place] [address] [phone]  TOTAL KNEE REPLACEMENT POSTOPERATIVE DIRECTIONS    Knee Rehabilitation, Guidelines Following Surgery  Results after knee surgery are often greatly improved when you follow the exercise, range of motion and muscle strengthening exercises prescribed by your doctor. Safety measures are also important to protect the knee from further injury. If any of these exercises cause you to have increased pain or swelling in your knee joint, decrease the amount until you are comfortable again and slowly increase them. If you have problems or questions, call your caregiver or physical therapist for advice.   BLOOD CLOT PREVENTION Take 81 mg Aspirin two times a day for three weeks following surgery. Then resume your regular dose of 81 mg Aspirin once a day. You may resume your vitamins/supplements upon discharge from the hospital. Do not take any NSAIDs (Advil, Aleve, Ibuprofen, Meloxicam, etc.) for 3 weeks, while taking 81mg  Aspirin twice a day.   HOME CARE INSTRUCTIONS  Remove items at home which could result in a fall. This includes throw rugs or furniture in walking pathways.  ICE to the affected knee as much as tolerated. Icing helps control swelling. If the swelling is well controlled you will be more comfortable and rehab easier. Continue to use ice on the knee for pain and swelling from surgery. You may notice swelling that will progress down to the foot and ankle. This is normal after surgery. Elevate the leg when you are not up walking on it.    Continue to use the breathing machine which will help keep your temperature down. It is common for your temperature to cycle up and down following surgery, especially at night when you are not up moving around and exerting yourself. The breathing machine keeps your lungs expanded and your temperature down. Do not place  pillow under the operative knee, focus on keeping the knee straight while resting  DIET You may resume your previous home diet once you are discharged from the hospital.  DRESSING / WOUND CARE / SHOWERING Keep your bulky bandage on for 2 days. On the third post-operative day you may remove the Ace bandage and gauze. There is a waterproof adhesive bandage on your skin which will stay in place until your first follow-up appointment. Once you remove this you will not need to place another bandage You may begin showering 3 days following surgery, but do not submerge the incision under water.  ACTIVITY For the first 5 days, the key is rest and control of pain and swelling Do your home exercises twice a day starting on post-operative day 3. On the days you go to physical therapy, just do the home exercises once that day. You should rest, ice and elevate the leg for 50 minutes out of every hour. Get up and walk/stretch for 10 minutes per hour. After 5 days you can increase your activity slowly as tolerated. Walk with your walker as instructed. Use the walker until you are comfortable transitioning to a cane. Walk with the cane in the opposite hand of the operative leg. You may discontinue the cane once you are comfortable and walking steadily. Avoid periods of inactivity such as sitting longer than an hour when not asleep. This helps prevent blood clots.  You may discontinue the knee immobilizer once you are able to perform a straight leg raise while lying down. You may resume a sexual relationship in one month or  when given the OK by your doctor.  You may return to work once you are cleared by your doctor.  Do not drive a car for 6 weeks or until released by your surgeon.  Do not drive while taking narcotics.  TED HOSE STOCKINGS Wear the elastic stockings on both legs for three weeks following surgery during the day. You may remove them at night for sleeping.  WEIGHT BEARING Weight bearing as  tolerated with assist device (walker, cane, etc) as directed, use it as long as suggested by your surgeon or therapist, typically at least 4-6 weeks.  POSTOPERATIVE CONSTIPATION PROTOCOL Constipation - defined medically as fewer than three stools per week and severe constipation as less than one stool per week.  One of the most common issues patients have following surgery is constipation.  Even if you have a regular bowel pattern at home, your normal regimen is likely to be disrupted due to multiple reasons following surgery.  Combination of anesthesia, postoperative narcotics, change in appetite and fluid intake all can affect your bowels.  In order to avoid complications following surgery, here are some recommendations in order to help you during your recovery period.  Colace (docusate) - Pick up an over-the-counter form of Colace or another stool softener and take twice a day as long as you are requiring postoperative pain medications.  Take with a full glass of water daily.  If you experience loose stools or diarrhea, hold the colace until you stool forms back up. If your symptoms do not get better within 1 week or if they get worse, check with your doctor. Dulcolax (bisacodyl) - Pick up over-the-counter and take as directed by the product packaging as needed to assist with the movement of your bowels.  Take with a full glass of water.  Use this product as needed if not relieved by Colace only.  MiraLax (polyethylene glycol) - Pick up over-the-counter to have on hand. MiraLax is a solution that will increase the amount of water in your bowels to assist with bowel movements.  Take as directed and can mix with a glass of water, juice, soda, coffee, or tea. Take if you go more than two days without a movement. Do not use MiraLax more than once per day. Call your doctor if you are still constipated or irregular after using this medication for 7 days in a row.  If you continue to have problems with  postoperative constipation, please contact the office for further assistance and recommendations.  If you experience "the worst abdominal pain ever" or develop nausea or vomiting, please contact the office immediatly for further recommendations for treatment.  ITCHING If you experience itching with your medications, try taking only a single pain pill, or even half a pain pill at a time.  You can also use Benadryl over the counter for itching or also to help with sleep.   MEDICATIONS See your medication summary on the "After Visit Summary" that the nursing staff will review with you prior to discharge.  You may have some home medications which will be placed on hold until you complete the course of blood thinner medication.  It is important for you to complete the blood thinner medication as prescribed by your surgeon.  Continue your approved medications as instructed at time of discharge.  PRECAUTIONS If you experience chest pain or shortness of breath - call 911 immediately for transfer to the hospital emergency department.  If you develop a fever greater that 101 F, purulent  drainage from wound, increased redness or drainage from wound, foul odor from the wound/dressing, or calf pain - CONTACT YOUR SURGEON.                                                   FOLLOW-UP APPOINTMENTS Make sure you keep all of your appointments after your operation with your surgeon and caregivers. You should call the office at the above phone number and make an appointment for approximately two weeks after the date of your surgery or on the date instructed by your surgeon outlined in the "After Visit Summary".  RANGE OF MOTION AND STRENGTHENING EXERCISES  Rehabilitation of the knee is important following a knee injury or an operation. After just a few days of immobilization, the muscles of the thigh which control the knee become weakened and shrink (atrophy). Knee exercises are designed to build up the tone and strength  of the thigh muscles and to improve knee motion. Often times heat used for twenty to thirty minutes before working out will loosen up your tissues and help with improving the range of motion but do not use heat for the first two weeks following surgery. These exercises can be done on a training (exercise) mat, on the floor, on a table or on a bed. Use what ever works the best and is most comfortable for you Knee exercises include:  Leg Lifts - While your knee is still immobilized in a splint or cast, you can do straight leg raises. Lift the leg to 60 degrees, hold for 3 sec, and slowly lower the leg. Repeat 10-20 times 2-3 times daily. Perform this exercise against resistance later as your knee gets better.  Quad and Hamstring Sets - Tighten up the muscle on the front of the thigh (Quad) and hold for 5-10 sec. Repeat this 10-20 times hourly. Hamstring sets are done by pushing the foot backward against an object and holding for 5-10 sec. Repeat as with quad sets.  Leg Slides: Lying on your back, slowly slide your foot toward your buttocks, bending your knee up off the floor (only go as far as is comfortable). Then slowly slide your foot back down until your leg is flat on the floor again. Angel Wings: Lying on your back spread your legs to the side as far apart as you can without causing discomfort.  A rehabilitation program following serious knee injuries can speed recovery and prevent re-injury in the future due to weakened muscles. Contact your doctor or a physical therapist for more information on knee rehabilitation.   POST-OPERATIVE OPIOID TAPER INSTRUCTIONS: It is important to wean off of your opioid medication as soon as possible. If you do not need pain medication after your surgery it is ok to stop day one. Opioids include: Codeine, Hydrocodone(Norco, Vicodin), Oxycodone(Percocet, oxycontin) and hydromorphone amongst others.  Long term and even short term use of opiods can cause: Increased pain  response Dependence Constipation Depression Respiratory depression And more.  Withdrawal symptoms can include Flu like symptoms Nausea, vomiting And more Techniques to manage these symptoms Hydrate well Eat regular healthy meals Stay active Use relaxation techniques(deep breathing, meditating, yoga) Do Not substitute Alcohol to help with tapering If you have been on opioids for less than two weeks and do not have pain than it is ok to stop all together.  Plan to  wean off of opioids This plan should start within one week post op of your joint replacement. Maintain the same interval or time between taking each dose and first decrease the dose.  Cut the total daily intake of opioids by one tablet each day Next start to increase the time between doses. The last dose that should be eliminated is the evening dose.   IF YOU ARE TRANSFERRED TO A SKILLED REHAB FACILITY If the patient is transferred to a skilled rehab facility following release from the hospital, a list of the current medications will be sent to the facility for the patient to continue.  When discharged from the skilled rehab facility, please have the facility set up the patient's Home Health Physical Therapy prior to being released. Also, the skilled facility will be responsible for providing the patient with their medications at time of release from the facility to include their pain medication, the muscle relaxants, and their blood thinner medication. If the patient is still at the rehab facility at time of the two week follow up appointment, the skilled rehab facility will also need to assist the patient in arranging follow up appointment in our office and any transportation needs.  MAKE SURE YOU:  Understand these instructions.  Get help right away if you are not doing well or get worse.   DENTAL ANTIBIOTICS:  In most cases prophylactic antibiotics for Dental procdeures after total joint surgery are not  necessary.  Exceptions are as follows:  1. History of prior total joint infection  2. Severely immunocompromised (Organ Transplant, cancer chemotherapy, Rheumatoid biologic meds such as Humera)  3. Poorly controlled diabetes (A1C &gt; 8.0, blood glucose over 200)  If you have one of these conditions, contact your surgeon for an antibiotic prescription, prior to your dental procedure.    Pick up stool softner and laxative for home use following surgery while on pain medications. Do not submerge incision under water. Please use good hand washing techniques while changing dressing each day. May shower starting three days after surgery. Please use a clean towel to pat the incision dry following showers. Continue to use ice for pain and swelling after surgery. Do not use any lotions or creams on the incision until instructed by your surgeon.

## 2023-08-05 NOTE — Progress Notes (Signed)
Patient arrived to Short Stay for surgery.  Patient SOB with conversation.  Right calf erythema, hard edema with weeping.  Abdomen distended and tight. Dr. Lequita Halt and Dr. Malen Gauze notified.

## 2023-08-05 NOTE — Progress Notes (Signed)
Patient is cancelled today, see dr Salina April note

## 2023-08-05 NOTE — Progress Notes (Signed)
Ryan Weeks presented today with edema and erythema in his right lower leg (the operative side) . He has a cut, superficial abrasion, in the lateral lower leg with surrounding erythema. I have discussed this with Ryan Roupe and feel that we should postpone his surgery due to an increased risk of infection if we proceed. He is disappointed but understands. He will follow up with me in 2 weeks in the office to recheck his leg

## 2023-08-26 NOTE — H&P (Signed)
TOTAL KNEE ADMISSION H&P  Patient is being admitted for right total knee arthroplasty.  Subjective:  Chief Complaint: Right knee pain.  HPI: Ryan Weeks., 72 y.o. male has a history of pain and functional disability in the right knee due to arthritis and has failed non-surgical conservative treatments for greater than 12 weeks to include NSAID's and/or analgesics and activity modification. Onset of symptoms was gradual, starting  several  years ago with gradually worsening course since that time. The patient noted no past surgery on the right knee.  Patient currently rates pain in the right knee at 8 out of 10 with activity. Patient has night pain, worsening of pain with activity and weight bearing, pain with passive range of motion, crepitus, and joint swelling. Patient has evidence of  bone-on-bone medial and patellofemoral compartments of the right knee  by imaging studies.There is no active infection.  Patient Active Problem List   Diagnosis Date Noted   OA (osteoarthritis) of knee 08/05/2023   Blood in stool    Noninfectious colitis    Bicuspid aortic valve 05/02/2021   Hypertension 05/02/2021   Tobacco abuse 05/02/2021   Diplopia 12/30/2019   Hypertensive urgency 12/30/2019   Hyperlipidemia 12/30/2019   Chronic anticoagulation 06/23/2018   Acute postoperative pain 06/15/2018   Paroxysmal atrial fibrillation (HCC) 06/15/2018   S/P AVR (aortic valve replacement) 06/15/2018   Nonrheumatic aortic valve stenosis 06/09/2018   PAD (peripheral artery disease) (HCC) 06/06/2018    Past Medical History:  Diagnosis Date   Arthritis    COPD (chronic obstructive pulmonary disease) (HCC)    Dyspnea    with exertion   GERD (gastroesophageal reflux disease)    Hypercholesteremia    Hypertension    Pre-diabetes     Past Surgical History:  Procedure Laterality Date   CARDIAC VALVE REPLACEMENT  2019   COLONOSCOPY WITH PROPOFOL N/A 05/09/2021   Procedure: COLONOSCOPY WITH PROPOFOL;   Surgeon: [name], MD;  Location: [facility] ENDOSCOPY;  Service: Endoscopy;  Laterality: N/A;   LUMBAR DISC SURGERY      Prior to Admission medications   Medication Sig Start Date End Date Taking? Authorizing Provider  acetaminophen (TYLENOL) 500 MG tablet Take 500 mg by mouth every 6 (six) hours as needed (for headaches).    [provider]  albuterol (VENTOLIN HFA) 108 (90 Base) MCG/ACT inhaler Inhale 2 puffs into the lungs every 4 (four) hours as needed for wheezing or shortness of breath. 07/15/23   [provider]  bumetanide (BUMEX) 2 MG tablet Take 2 mg by mouth every morning. 05/30/23 05/29/24  [provider]  citalopram (CELEXA) 20 MG tablet Take 20 mg by mouth every morning. 04/27/21   [provider]  losartan (COZAAR) 100 MG tablet Take 100 mg by mouth in the morning. 12/10/19   [provider]  Magnesium 500 MG TABS Take 500 mg by mouth at bedtime.    [provider]  metoprolol succinate (TOPROL-XL) 25 MG 24 hr tablet Take 25 mg by mouth every morning. 10/28/19   [provider]  pravastatin (PRAVACHOL) 80 MG tablet Take 1 tablet (80 mg total) by mouth at bedtime. 12/31/19   Rhetta Mura, MD    No Known Allergies  Social History   Socioeconomic History   Marital status: Widowed    Spouse name: Not on file   Number of children: Not on file   Years of education: Not on file   Highest education level: Not on file  Occupational  History   Not on file  Tobacco Use   Smoking status: Every Day    Types: Cigarettes   Smokeless tobacco: Never   Tobacco comments:    Smoking for 40 years now  Vaping Use   Vaping status: Never Used  Substance and Sexual Activity   Alcohol use: Yes    Alcohol/week: 2.0 standard drinks of alcohol    Types: 2 Cans of beer per week    Comment: 2 beers daily   Drug use: Never   Sexual activity: Not on file  Other Topics Concern   Not on file  Social History Narrative   Not on file    Social Determinants of Health   Financial Resource Strain: Low Risk  (10/22/2022)   Received from [facility]   Overall Financial Resource Strain (CARDIA)    Difficulty of Paying Living Expenses: Not hard at all  Food Insecurity: No Food Insecurity (10/22/2022)   Received from [facility]   Hunger Vital Sign    Worried About Running Out of Food in the Last Year: Never true    Ran Out of Food in the Last Year: Never true  Transportation Needs: No Transportation Needs (10/22/2022)   Received [facility]    In the past 12 months, has lack of transportation kept you from medical appointments or from getting medications?: No    Lack of Transportation (Non-Medical): No  Physical Activity: Not on file  Stress: Not on file  Social Connections: Not on file  Intimate Partner Violence: Not on file    Tobacco Use: High Risk (07/29/2023)   Patient History    Smoking Tobacco Use: Every Day    Smokeless Tobacco Use: Never    Passive Exposure: Not on file   Social History   Substance and Sexual Activity  Alcohol Use Yes   Alcohol/week: 2.0 standard drinks of alcohol   Types: 2 Cans of beer per week   Comment: 2 beers daily    No family history on file.  ROS  Objective:  Physical Exam: - Well-developed male, alert and oriented, no apparent distress.    - Left knee shows no effusion. Range of motion is about 0 to 125 with crepitus on range of motion. There is no joint line tenderness or instability.    - Right hip shows normal range of motion with no discomfort.    - Right knee shows a moderate effusion. Range of motion is about 5 to 120 with crepitus on range of motion. He is tender medial and lateral with no instability noted. There is a Baker's cyst on the right. His gait pattern is antalgic on the right.  Imaging Review Plain radiographs demonstrate severe degenerative joint disease of the  right knee. The overall alignment is neutral. The bone quality appears to be adequate for age and reported activity level.  Assessment/Plan:  End stage arthritis, right knee   The patient history, physical examination, clinical judgment of the provider and imaging studies are consistent with end stage degenerative joint disease of the right knee and total knee arthroplasty is deemed medically necessary. The treatment options including medical management, injection therapy arthroscopy and arthroplasty were discussed at length. The risks and benefits of total knee arthroplasty were presented and reviewed. The risks due to aseptic loosening, infection, stiffness, patella tracking problems, thromboembolic complications and other imponderables were discussed. The patient acknowledged the explanation, agreed to proceed with the plan  and consent was signed. Patient is being admitted for inpatient treatment for surgery, pain control, PT, OT, prophylactic antibiotics, VTE prophylaxis, progressive ambulation and ADLs and discharge planning. The patient is planning to be discharged  home .   Patient's anticipated LOS is less than 2 midnights, meeting these requirements: - Lives within 1 hour of care - Has a competent adult at home to recover with post-op recover - NO history of  - Chronic pain requiring opioids  - Diabetes  - Coronary Artery Disease  - Heart failure  - Heart attack  - Stroke  - DVT/VTE  - Cardiac arrhythmia  - Respiratory Failure/COPD  - Renal failure  - Anemia  - Advanced Liver disease  Therapy Plans: [facility] [place] Disposition: Home with son Planned DVT Prophylaxis: ASA 81mg  BID DME Needed: None PCP: Kandyce Rud, MD (clearance received) Cardiologist: Dr Clyde Canterbury (clearance received) TXA: IV Allergies: NKDA Anesthesia Concerns: None BMI: 25.8 Last HgbA1c: Not diabetic [facility]acy: [facility] [address], [place]  - Patient was instructed on what medications to stop  prior to surgery. - Follow-up visit in 2 weeks with Dr. Lequita Halt - Begin physical therapy following surgery - Pre-operative lab work as pre-surgical testing - Prescriptions will be provided in hospital at time of discharge  Arther Abbott, PA-C Orthopedic Surgery [facility]

## 2023-08-28 NOTE — Patient Instructions (Signed)
SURGICAL WAITING ROOM VISITATION  Patients having surgery or a procedure may have no more than 2 support people in the waiting area - these visitors may rotate.    Children under the age of 53 must have an adult with them who is not the patient.  Due to an increase in RSV and influenza rates and associated hospitalizations, children ages 92 and under may not visit patients in [facility] hospitals.  If the patient needs to stay at the hospital during part of their recovery, the visitor guidelines for inpatient rooms apply. Pre-op nurse will coordinate an appropriate time for 1 support person to accompany patient in pre-op.  This support person may not rotate.    Please refer to the [facility] website for the visitor guidelines for Inpatients (after your surgery is over and you are in a regular room).    Your procedure is scheduled on: 09/02/23   Report to [facility] Main Entrance    Report to admitting at 11:15 AM   Call this number if you have problems the morning of surgery [phone]   Do not eat food :After Midnight.   After Midnight you may have the following liquids until 10:45 AM DAY OF SURGERY  Water Non-Citrus Juices (without pulp, NO RED-Apple, White grape, White cranberry) Black Coffee (NO MILK/CREAM OR CREAMERS, sugar ok)  Clear Tea (NO MILK/CREAM OR CREAMERS, sugar ok) regular and decaf                             Plain Jell-O (NO RED)                                           Fruit ices (not with fruit pulp, NO RED)                                     Popsicles (NO RED)                                                               Sports drinks like Gatorade (NO RED)               The day of surgery:  Drink ONE (1) Pre-Surgery Clear Ensure at 10:45 AM the morning of surgery. Drink in one sitting. Do not sip.  This drink was given to you during your hospital  pre-op appointment visit. Nothing else to drink after completing the  Pre-Surgery Clear  Ensure.          If you have questions, please contact your surgeon's office.   FOLLOW BOWEL PREP AND ANY ADDITIONAL PRE OP INSTRUCTIONS YOU RECEIVED FROM YOUR SURGEON'S OFFICE!!!     Oral Hygiene is also important to reduce your risk of infection.                                    Remember - BRUSH YOUR TEETH THE MORNING OF SURGERY WITH YOUR REGULAR TOOTHPASTE  DENTURES WILL BE REMOVED  PRIOR TO SURGERY PLEASE DO NOT APPLY "Poly grip" OR ADHESIVES!!!   Do NOT smoke after Midnight   Stop all vitamins and herbal supplements 7 days before surgery.   Take these medicines the morning of surgery with A SIP OF WATER: Tylenol, Albuterol, Citalopram, Metoprolol   DO NOT TAKE ANY ORAL DIABETIC MEDICATIONS DAY OF YOUR SURGERY  Bring CPAP mask and tubing day of surgery.                              You may not have any metal on your body including jewelry, and body piercing             Do not wear lotions, powders, cologne, or deodorant              Men may shave face and neck.   Do not bring valuables to the hospital. [facility] IS NOT             RESPONSIBLE   FOR VALUABLES.   Contacts, glasses, dentures or bridgework may not be worn into surgery.   Bring small overnight bag day of surgery.   DO NOT BRING YOUR HOME MEDICATIONS TO THE HOSPITAL. PHARMACY WILL DISPENSE MEDICATIONS LISTED ON YOUR MEDICATION LIST TO YOU DURING YOUR ADMISSION IN THE HOSPITAL!    Special Instructions: Bring a copy of your healthcare power of attorney and living will documents the day of surgery if you haven't scanned them before.              Please read over the following fact sheets you were given: IF YOU HAVE QUESTIONS ABOUT YOUR PRE-OP INSTRUCTIONS PLEASE CALL [phone]    If you received a COVID test during your pre-op visit  it is requested that you wear a mask when out in public, stay away from anyone that may not be feeling well and notify your surgeon if you develop symptoms. If you  test positive for Covid or have been in contact with anyone that has tested positive in the last 10 days please notify you surgeon.      Pre-operative 5 CHG Bath Instructions   You can play a key role in reducing the risk of infection after surgery. Your skin needs to be as free of germs as possible. You can reduce the number of germs on your skin by washing with CHG (chlorhexidine gluconate) soap before surgery. CHG is an antiseptic soap that kills germs and continues to kill germs even after washing.   DO NOT use if you have an allergy to chlorhexidine/CHG or antibacterial soaps. If your skin becomes reddened or irritated, stop using the CHG and notify one of our RNs at [phone].   Please shower with the CHG soap starting 4 days before surgery using the following schedule:     Please keep in mind the following:  DO NOT shave, including legs and underarms, starting the day of your first shower.   You may shave your face at any point before/day of surgery.  Place clean sheets on your bed the day you start using CHG soap. Use a clean washcloth (not used since being washed) for each shower. DO NOT sleep with pets once you start using the CHG.   CHG Shower Instructions:  If you choose to wash your hair and private area, wash first with your normal shampoo/soap.  After you use shampoo/soap, rinse your hair and body thoroughly to remove shampoo/soap  residue.  Turn the water OFF and apply about 3 tablespoons (45 ml) of CHG soap to a CLEAN washcloth.  Apply CHG soap ONLY FROM YOUR NECK DOWN TO YOUR TOES (washing for 3-5 minutes)  DO NOT use CHG soap on face, private areas, open wounds, or sores.  Pay special attention to the area where your surgery is being performed.  If you are having back surgery, having someone wash your back for you may be helpful. Wait 2 minutes after CHG soap is applied, then you may rinse off the CHG soap.  Pat dry with a clean towel  Put on clean clothes/pajamas    If you choose to wear lotion, please use ONLY the CHG-compatible lotions on the back of this paper.     Additional instructions for the day of surgery: DO NOT APPLY any lotions, deodorants, cologne, or perfumes.   Put on clean/comfortable clothes.  Brush your teeth.  Ask your nurse before applying any prescription medications to the skin.      CHG Compatible Lotions   Aveeno Moisturizing lotion  Cetaphil Moisturizing Cream  Cetaphil Moisturizing Lotion  Clairol Herbal Essence Moisturizing Lotion, Dry Skin  Clairol Herbal Essence Moisturizing Lotion, Extra Dry Skin  Clairol Herbal Essence Moisturizing Lotion, Normal Skin  Curel Age Defying Therapeutic Moisturizing Lotion with Alpha Hydroxy  Curel Extreme Care Body Lotion  Curel Soothing Hands Moisturizing Hand Lotion  Curel Therapeutic Moisturizing Cream, Fragrance-Free  Curel Therapeutic Moisturizing Lotion, Fragrance-Free  Curel Therapeutic Moisturizing Lotion, Original Formula  Eucerin Daily Replenishing Lotion  Eucerin Dry Skin Therapy Plus Alpha Hydroxy Crme  Eucerin Dry Skin Therapy Plus Alpha Hydroxy Lotion  Eucerin Original Crme  Eucerin Original Lotion  Eucerin Plus Crme Eucerin Plus Lotion  Eucerin TriLipid Replenishing Lotion  Keri Anti-Bacterial Hand Lotion  Keri Deep Conditioning Original Lotion Dry Skin Formula Softly Scented  Keri Deep Conditioning Original Lotion, Fragrance Free Sensitive Skin Formula  Keri Lotion Fast Absorbing Fragrance Free Sensitive Skin Formula  Keri Lotion Fast Absorbing Softly Scented Dry Skin Formula  Keri Original Lotion  Keri Skin Renewal Lotion Keri Silky Smooth Lotion  Keri Silky Smooth Sensitive Skin Lotion  Nivea Body Creamy Conditioning Oil  Nivea Body Extra Enriched Lotion  Nivea Body Original Lotion  Nivea Body Sheer Moisturizing Lotion Nivea Crme  Nivea Skin Firming Lotion  NutraDerm 30 Skin Lotion  NutraDerm Skin Lotion  NutraDerm Therapeutic Skin Cream   NutraDerm Therapeutic Skin Lotion  ProShield Protective Hand Cream  Provon moisturizing lotion   Incentive Spirometer  An incentive spirometer is a tool that can help keep your lungs clear and active. This tool measures how well you are filling your lungs with each breath. Taking long deep breaths may help reverse or decrease the chance of developing breathing (pulmonary) problems (especially infection) following: A long period of time when you are unable to move or be active. BEFORE THE PROCEDURE  If the spirometer includes an indicator to show your best effort, your nurse or respiratory therapist will set it to a desired goal. If possible, sit up straight or lean slightly forward. Try not to slouch. Hold the incentive spirometer in an upright position. INSTRUCTIONS FOR USE  Sit on the edge of your bed if possible, or sit up as far as you can in bed or on a chair. Hold the incentive spirometer in an upright position. Breathe out normally. Place the mouthpiece in your mouth and seal your lips tightly around it. Breathe in slowly and as deeply as  possible, raising the piston or the ball toward the top of the column. Hold your breath for 3-5 seconds or for as long as possible. Allow the piston or ball to fall to the bottom of the column. Remove the mouthpiece from your mouth and breathe out normally. Rest for a few seconds and repeat Steps 1 through 7 at least 10 times every 1-2 hours when you are awake. Take your time and take a few normal breaths between deep breaths. The spirometer may include an indicator to show your best effort. Use the indicator as a goal to work toward during each repetition. After each set of 10 deep breaths, practice coughing to be sure your lungs are clear. If you have an incision (the cut made at the time of surgery), support your incision when coughing by placing a pillow or rolled up towels firmly against it. Once you are able to get out of bed, walk around indoors  and cough well. You may stop using the incentive spirometer when instructed by your caregiver.  RISKS AND COMPLICATIONS Take your time so you do not get dizzy or light-headed. If you are in pain, you may need to take or ask for pain medication before doing incentive spirometry. It is harder to take a deep breath if you are having pain. AFTER USE Rest and breathe slowly and easily. It can be helpful to keep track of a log of your progress. Your caregiver can provide you with a simple table to help with this. If you are using the spirometer at home, follow these instructions: SEEK MEDICAL CARE IF:  You are having difficultly using the spirometer. You have trouble using the spirometer as often as instructed. Your pain medication is not giving enough relief while using the spirometer. You develop fever of 100.5 F (38.1 C) or higher. SEEK IMMEDIATE MEDICAL CARE IF:  You cough up bloody sputum that had not been present before. You develop fever of 102 F (38.9 C) or greater. You develop worsening pain at or near the incision site. MAKE SURE YOU:  Understand these instructions. Will watch your condition. Will get help right away if you are not doing well or get worse. Document Released: 01/21/2007 Document Revised: 12/03/2011 Document Reviewed: 03/24/2007 [facility] Patient Information 2014 [place], [place].   ________________________________________________________________________

## 2023-08-28 NOTE — Progress Notes (Addendum)
COVID Vaccine Completed:  Date of COVID positive in last 90 days:  PCP - Kandyce Rud, MD Cardiologist - Dr. Melton Alar  Cardiac clearance by Dr. Melton Alar 07/15/23 on chart Medical clearance 07/12/23 on chart  Chest x-ray -  EKG - 05/16/23 [facility] Stress Test - years ago per pt ECHO - 06/03/23 [facility] Cardiac Cath - 05/12/18 [facility] Pacemaker/ICD device last checked: n/a Spinal Cord Stimulator:n/a  Bowel Prep - no  Sleep Study - n/a CPAP -   Fasting Blood Sugar - preDM, no meds or checks at home Checks Blood Sugar _____ times a day  Last dose of GLP1 agonist-  N/A GLP1 instructions:  Hold 7 days before surgery    Last dose of SGLT-2 inhibitors-  N/A SGLT-2 instructions:  Hold 3 days before surgery    Blood Thinner Instructions: n/a Aspirin Instructions: Last Dose:  Activity level: Can go up a flight of stairs and perform activities of daily living without stopping and without symptoms of chest pain or shortness of breath. SOB at baseline Slow with stairs due to knee  Anesthesia review: HTN, a fib, nonrheumatic aortic valve stenosis, PAD, aortic valve replacement, COPD  Patient denies shortness of breath, fever, cough and chest pain at PAT appointment  Patient verbalized understanding of instructions that were given to them at the PAT appointment. Patient was also instructed that they will need to review over the PAT instructions again at home before surgery.

## 2023-08-29 ENCOUNTER — Other Ambulatory Visit: Payer: Self-pay

## 2023-08-29 ENCOUNTER — Encounter (HOSPITAL_COMMUNITY)
Admission: RE | Admit: 2023-08-29 | Discharge: 2023-08-29 | Disposition: A | Discharge status: Home / Self Care | Payer: Medicare HMO | Source: Ambulatory Visit | Attending: Orthopedic Surgery | Admitting: Orthopedic Surgery

## 2023-08-29 ENCOUNTER — Encounter (HOSPITAL_COMMUNITY): Payer: Self-pay

## 2023-08-29 VITALS — BP 116/59 | HR 62 | Temp 97.9°F | Resp 16 | Ht 69.0 in | Wt 173.0 lb

## 2023-08-29 DIAGNOSIS — I10 Essential (primary) hypertension: Secondary | ICD-10-CM | POA: Insufficient documentation

## 2023-08-29 DIAGNOSIS — Z01818 Encounter for other preprocedural examination: Secondary | ICD-10-CM | POA: Diagnosis present

## 2023-08-29 HISTORY — DX: Family history of other specified conditions: Z84.89

## 2023-08-29 LAB — CBC
HCT: 42 % (ref 39.0–52.0)
Hemoglobin: 13.3 g/dL (ref 13.0–17.0)
MCH: 32.7 pg (ref 26.0–34.0)
MCHC: 31.7 g/dL (ref 30.0–36.0)
MCV: 103.2 fL — ABNORMAL HIGH (ref 80.0–100.0)
Platelets: 176 10*3/uL (ref 150–400)
RBC: 4.07 MIL/uL — ABNORMAL LOW (ref 4.22–5.81)
RDW: 16.7 % — ABNORMAL HIGH (ref 11.5–15.5)
WBC: 8 10*3/uL (ref 4.0–10.5)
nRBC: 0 % (ref 0.0–0.2)

## 2023-08-29 LAB — BASIC METABOLIC PANEL
Anion gap: 12 (ref 5–15)
BUN: 20 mg/dL (ref 8–23)
CO2: 26 mmol/L (ref 22–32)
Calcium: 8.5 mg/dL — ABNORMAL LOW (ref 8.9–10.3)
Chloride: 96 mmol/L — ABNORMAL LOW (ref 98–111)
Creatinine, Ser: 0.82 mg/dL (ref 0.61–1.24)
GFR, Estimated: >60 mL/min (ref >60)
Glucose, Bld: 107 mg/dL — ABNORMAL HIGH (ref 70–99)
Potassium: 4.1 mmol/L (ref 3.5–5.1)
Sodium: 134 mmol/L — ABNORMAL LOW (ref 135–145)

## 2023-08-29 LAB — SURGICAL PCR SCREEN
MRSA, PCR: NEGATIVE
Staphylococcus aureus: NEGATIVE

## 2023-09-02 ENCOUNTER — Encounter (HOSPITAL_COMMUNITY): Payer: Self-pay | Admitting: Orthopedic Surgery

## 2023-09-02 ENCOUNTER — Observation Stay (HOSPITAL_COMMUNITY): Payer: Medicare HMO

## 2023-09-02 ENCOUNTER — Other Ambulatory Visit: Payer: Self-pay

## 2023-09-02 ENCOUNTER — Encounter (HOSPITAL_COMMUNITY)
Admission: RE | Disposition: A | Discharge status: Home / Self Care | Payer: Self-pay | Source: Home / Self Care | Attending: Orthopedic Surgery

## 2023-09-02 ENCOUNTER — Ambulatory Visit (HOSPITAL_BASED_OUTPATIENT_CLINIC_OR_DEPARTMENT_OTHER): Payer: Medicare HMO | Admitting: Anesthesiology

## 2023-09-02 ENCOUNTER — Inpatient Hospital Stay (HOSPITAL_COMMUNITY): Payer: Self-pay | Admitting: Physician Assistant

## 2023-09-02 ENCOUNTER — Inpatient Hospital Stay (HOSPITAL_COMMUNITY)
Admission: RE | Admit: 2023-09-02 | Discharge: 2023-09-06 | DRG: 469 | Disposition: A | Discharge status: Home / Self Care | Payer: Medicare HMO | Attending: Orthopedic Surgery | Admitting: Orthopedic Surgery

## 2023-09-02 DIAGNOSIS — I5043 Acute on chronic combined systolic (congestive) and diastolic (congestive) heart failure: Secondary | ICD-10-CM | POA: Insufficient documentation

## 2023-09-02 DIAGNOSIS — J918 Pleural effusion in other conditions classified elsewhere: Secondary | ICD-10-CM | POA: Diagnosis present

## 2023-09-02 DIAGNOSIS — R140 Abdominal distension (gaseous): Secondary | ICD-10-CM | POA: Diagnosis present

## 2023-09-02 DIAGNOSIS — Z716 Tobacco abuse counseling: Secondary | ICD-10-CM

## 2023-09-02 DIAGNOSIS — E1151 Type 2 diabetes mellitus with diabetic peripheral angiopathy without gangrene: Secondary | ICD-10-CM | POA: Diagnosis present

## 2023-09-02 DIAGNOSIS — D72829 Elevated white blood cell count, unspecified: Secondary | ICD-10-CM | POA: Diagnosis present

## 2023-09-02 DIAGNOSIS — Z7901 Long term (current) use of anticoagulants: Secondary | ICD-10-CM

## 2023-09-02 DIAGNOSIS — K7460 Unspecified cirrhosis of liver: Secondary | ICD-10-CM | POA: Insufficient documentation

## 2023-09-02 DIAGNOSIS — E785 Hyperlipidemia, unspecified: Secondary | ICD-10-CM | POA: Diagnosis present

## 2023-09-02 DIAGNOSIS — F17210 Nicotine dependence, cigarettes, uncomplicated: Secondary | ICD-10-CM | POA: Diagnosis present

## 2023-09-02 DIAGNOSIS — D6959 Other secondary thrombocytopenia: Secondary | ICD-10-CM | POA: Diagnosis present

## 2023-09-02 DIAGNOSIS — M1711 Unilateral primary osteoarthritis, right knee: Principal | ICD-10-CM | POA: Diagnosis present

## 2023-09-02 DIAGNOSIS — K219 Gastro-esophageal reflux disease without esophagitis: Secondary | ICD-10-CM | POA: Diagnosis present

## 2023-09-02 DIAGNOSIS — M1710 Unilateral primary osteoarthritis, unspecified knee: Principal | ICD-10-CM | POA: Diagnosis present

## 2023-09-02 DIAGNOSIS — I739 Peripheral vascular disease, unspecified: Secondary | ICD-10-CM | POA: Diagnosis present

## 2023-09-02 DIAGNOSIS — J9811 Atelectasis: Secondary | ICD-10-CM | POA: Diagnosis present

## 2023-09-02 DIAGNOSIS — Z720 Tobacco use: Secondary | ICD-10-CM | POA: Diagnosis present

## 2023-09-02 DIAGNOSIS — G8929 Other chronic pain: Secondary | ICD-10-CM | POA: Diagnosis present

## 2023-09-02 DIAGNOSIS — E7800 Pure hypercholesterolemia, unspecified: Secondary | ICD-10-CM | POA: Diagnosis present

## 2023-09-02 DIAGNOSIS — J441 Chronic obstructive pulmonary disease with (acute) exacerbation: Secondary | ICD-10-CM | POA: Diagnosis present

## 2023-09-02 DIAGNOSIS — J9601 Acute respiratory failure with hypoxia: Secondary | ICD-10-CM | POA: Diagnosis present

## 2023-09-02 DIAGNOSIS — I10 Essential (primary) hypertension: Secondary | ICD-10-CM | POA: Diagnosis present

## 2023-09-02 DIAGNOSIS — K7031 Alcoholic cirrhosis of liver with ascites: Secondary | ICD-10-CM | POA: Diagnosis present

## 2023-09-02 DIAGNOSIS — J90 Pleural effusion, not elsewhere classified: Secondary | ICD-10-CM | POA: Insufficient documentation

## 2023-09-02 DIAGNOSIS — Z79899 Other long term (current) drug therapy: Secondary | ICD-10-CM

## 2023-09-02 DIAGNOSIS — M179 Osteoarthritis of knee, unspecified: Principal | ICD-10-CM | POA: Diagnosis present

## 2023-09-02 DIAGNOSIS — Z7982 Long term (current) use of aspirin: Secondary | ICD-10-CM

## 2023-09-02 DIAGNOSIS — I480 Paroxysmal atrial fibrillation: Secondary | ICD-10-CM | POA: Diagnosis present

## 2023-09-02 DIAGNOSIS — I3139 Other pericardial effusion (noninflammatory): Secondary | ICD-10-CM | POA: Diagnosis present

## 2023-09-02 DIAGNOSIS — D696 Thrombocytopenia, unspecified: Secondary | ICD-10-CM | POA: Diagnosis present

## 2023-09-02 DIAGNOSIS — R7303 Prediabetes: Secondary | ICD-10-CM | POA: Diagnosis present

## 2023-09-02 DIAGNOSIS — Z952 Presence of prosthetic heart valve: Secondary | ICD-10-CM

## 2023-09-02 DIAGNOSIS — D539 Nutritional anemia, unspecified: Secondary | ICD-10-CM | POA: Diagnosis present

## 2023-09-02 HISTORY — PX: TOTAL KNEE ARTHROPLASTY: SHX125

## 2023-09-02 SURGERY — ARTHROPLASTY, KNEE, TOTAL
Anesthesia: Monitor Anesthesia Care | Site: Knee | Laterality: Right

## 2023-09-02 MED ORDER — DOCUSATE SODIUM 100 MG PO CAPS
100.0000 mg | ORAL_CAPSULE | Freq: Two times a day (BID) | ORAL | Status: DC
Start: 2023-09-02 — End: 2023-09-06
  Administered 2023-09-02 – 2023-09-06 (×8): 100 mg via ORAL
  Filled 2023-09-02 (×8): qty 1

## 2023-09-02 MED ORDER — ACETAMINOPHEN 500 MG PO TABS: 1000.0000 mg | ORAL_TABLET | Freq: Once | ORAL | Status: AC

## 2023-09-02 MED ORDER — ACETAMINOPHEN 10 MG/ML IV SOLN
1000.0000 mg | Freq: Four times a day (QID) | INTRAVENOUS | Status: DC
  Administered 2023-09-02: 1000 mg via INTRAVENOUS
  Filled 2023-09-02: qty 100

## 2023-09-02 MED ORDER — SODIUM CHLORIDE (PF) 0.9 % IJ SOLN
INTRAMUSCULAR | Status: DC | PRN
  Administered 2023-09-02: 60 mL via INTRAVENOUS

## 2023-09-02 MED ORDER — BISACODYL 10 MG RE SUPP: 10.0000 mg | Freq: Every day | RECTAL | Status: DC | PRN

## 2023-09-02 MED ORDER — PHENYLEPHRINE 80 MCG/ML (10ML) SYRINGE FOR IV PUSH (FOR BLOOD PRESSURE SUPPORT)
PREFILLED_SYRINGE | INTRAVENOUS | Status: DC | PRN
  Administered 2023-09-02: 40 ug via INTRAVENOUS
  Administered 2023-09-02: 80 ug via INTRAVENOUS

## 2023-09-02 MED ORDER — STERILE WATER FOR IRRIGATION IR SOLN
Status: DC | PRN
  Administered 2023-09-02: 1000 mL

## 2023-09-02 MED ORDER — DIPHENHYDRAMINE HCL 12.5 MG/5ML PO ELIX: 12.5000 mg | ORAL_SOLUTION | ORAL | Status: DC | PRN

## 2023-09-02 MED ORDER — CHLORHEXIDINE GLUCONATE 0.12 % MT SOLN
15.0000 mL | Freq: Once | OROMUCOSAL | Status: AC
  Administered 2023-09-02: 15 mL via OROMUCOSAL

## 2023-09-02 MED ORDER — POLYETHYLENE GLYCOL 3350 17 G PO PACK
17.0000 g | PACK | Freq: Every day | ORAL | Status: DC | PRN
  Administered 2023-09-05 – 2023-09-06 (×2): 17 g via ORAL
  Filled 2023-09-02 (×2): qty 1

## 2023-09-02 MED ORDER — BUPIVACAINE LIPOSOME 1.3 % IJ SUSP
INTRAMUSCULAR | Status: AC
  Filled 2023-09-02: qty 20

## 2023-09-02 MED ORDER — ALBUTEROL SULFATE (2.5 MG/3ML) 0.083% IN NEBU: 2.5000 mg | INHALATION_SOLUTION | RESPIRATORY_TRACT | Status: DC | PRN

## 2023-09-02 MED ORDER — CEFAZOLIN SODIUM-DEXTROSE 2-4 GM/100ML-% IV SOLN
2.0000 g | INTRAVENOUS | Status: AC
  Administered 2023-09-02: 2 g via INTRAVENOUS
  Filled 2023-09-02: qty 100

## 2023-09-02 MED ORDER — MIDAZOLAM HCL 2 MG/2ML IJ SOLN
1.0000 mg | INTRAMUSCULAR | Status: DC
Start: 2023-09-02 — End: 2023-09-02
  Administered 2023-09-02 (×2): 2 mg via INTRAVENOUS
  Filled 2023-09-02: qty 2

## 2023-09-02 MED ORDER — PROPOFOL 1000 MG/100ML IV EMUL
INTRAVENOUS | Status: AC
  Filled 2023-09-02: qty 100

## 2023-09-02 MED ORDER — FENTANYL CITRATE PF 50 MCG/ML IJ SOSY: 25.0000 ug | PREFILLED_SYRINGE | INTRAMUSCULAR | Status: DC | PRN

## 2023-09-02 MED ORDER — FENTANYL CITRATE (PF) 100 MCG/2ML IJ SOLN
INTRAMUSCULAR | Status: AC
  Filled 2023-09-02: qty 2

## 2023-09-02 MED ORDER — OXYCODONE HCL 5 MG PO TABS
10.0000 mg | ORAL_TABLET | ORAL | Status: DC | PRN
  Administered 2023-09-03 (×2): 15 mg via ORAL
  Administered 2023-09-03: 10 mg via ORAL
  Administered 2023-09-03 – 2023-09-06 (×14): 15 mg via ORAL
  Filled 2023-09-02 (×2): qty 3
  Filled 2023-09-02: qty 2
  Filled 2023-09-02 (×3): qty 3
  Filled 2023-09-02: qty 2
  Filled 2023-09-02 (×11): qty 3

## 2023-09-02 MED ORDER — DEXAMETHASONE SODIUM PHOSPHATE 10 MG/ML IJ SOLN
INTRAMUSCULAR | Status: AC
  Filled 2023-09-02: qty 1

## 2023-09-02 MED ORDER — LOSARTAN POTASSIUM 50 MG PO TABS
100.0000 mg | ORAL_TABLET | Freq: Every morning | ORAL | Status: DC
  Administered 2023-09-03 – 2023-09-04 (×2): 100 mg via ORAL
  Filled 2023-09-02 (×2): qty 2

## 2023-09-02 MED ORDER — MIDAZOLAM HCL 2 MG/2ML IJ SOLN
INTRAMUSCULAR | Status: AC
  Filled 2023-09-02: qty 2

## 2023-09-02 MED ORDER — PHENYLEPHRINE 80 MCG/ML (10ML) SYRINGE FOR IV PUSH (FOR BLOOD PRESSURE SUPPORT)
PREFILLED_SYRINGE | INTRAVENOUS | Status: AC
  Filled 2023-09-02: qty 10

## 2023-09-02 MED ORDER — DEXAMETHASONE SODIUM PHOSPHATE 10 MG/ML IJ SOLN
10.0000 mg | Freq: Once | INTRAMUSCULAR | Status: AC
  Administered 2023-09-03: 10 mg via INTRAVENOUS
  Filled 2023-09-02: qty 1

## 2023-09-02 MED ORDER — SODIUM CHLORIDE (PF) 0.9 % IJ SOLN
INTRAMUSCULAR | Status: AC
  Filled 2023-09-02: qty 50

## 2023-09-02 MED ORDER — ONDANSETRON HCL 4 MG/2ML IJ SOLN
4.0000 mg | Freq: Four times a day (QID) | INTRAMUSCULAR | Status: DC | PRN
Start: 2023-09-02 — End: 2023-09-06

## 2023-09-02 MED ORDER — ASPIRIN 81 MG PO CHEW
81.0000 mg | CHEWABLE_TABLET | Freq: Two times a day (BID) | ORAL | Status: DC
  Administered 2023-09-03 – 2023-09-06 (×7): 81 mg via ORAL
  Filled 2023-09-02 (×7): qty 1

## 2023-09-02 MED ORDER — IPRATROPIUM-ALBUTEROL 0.5-2.5 (3) MG/3ML IN SOLN
3.0000 mL | RESPIRATORY_TRACT | Status: DC
  Administered 2023-09-02: 3 mL via RESPIRATORY_TRACT

## 2023-09-02 MED ORDER — METOPROLOL SUCCINATE ER 25 MG PO TB24
25.0000 mg | ORAL_TABLET | Freq: Every morning | ORAL | Status: DC
  Administered 2023-09-03 – 2023-09-06 (×4): 25 mg via ORAL
  Filled 2023-09-02 (×4): qty 1

## 2023-09-02 MED ORDER — CEFAZOLIN SODIUM-DEXTROSE 2-4 GM/100ML-% IV SOLN
2.0000 g | Freq: Four times a day (QID) | INTRAVENOUS | Status: AC
  Administered 2023-09-02 – 2023-09-03 (×2): 2 g via INTRAVENOUS
  Filled 2023-09-02 (×2): qty 100

## 2023-09-02 MED ORDER — METHOCARBAMOL 500 MG PO TABS
500.0000 mg | ORAL_TABLET | Freq: Four times a day (QID) | ORAL | Status: DC | PRN
  Administered 2023-09-03 – 2023-09-06 (×8): 500 mg via ORAL
  Filled 2023-09-02 (×10): qty 1

## 2023-09-02 MED ORDER — OXYCODONE HCL 5 MG PO TABS
5.0000 mg | ORAL_TABLET | ORAL | Status: DC | PRN
  Administered 2023-09-02: 5 mg via ORAL
  Filled 2023-09-02: qty 1

## 2023-09-02 MED ORDER — ACETAMINOPHEN 500 MG PO TABS
1000.0000 mg | ORAL_TABLET | Freq: Four times a day (QID) | ORAL | Status: AC
  Administered 2023-09-02 – 2023-09-03 (×4): 1000 mg via ORAL
  Filled 2023-09-02 (×4): qty 2

## 2023-09-02 MED ORDER — ALBUTEROL SULFATE HFA 108 (90 BASE) MCG/ACT IN AERS
INHALATION_SPRAY | RESPIRATORY_TRACT | Status: DC | PRN
  Administered 2023-09-02: 6 via RESPIRATORY_TRACT

## 2023-09-02 MED ORDER — BUMETANIDE 1 MG PO TABS
3.0000 mg | ORAL_TABLET | ORAL | Status: DC
Start: 2023-09-04 — End: 2023-09-06
  Administered 2023-09-04 – 2023-09-06 (×2): 3 mg via ORAL
  Filled 2023-09-02 (×2): qty 3

## 2023-09-02 MED ORDER — LACTATED RINGERS IV SOLN: INTRAVENOUS | Status: DC

## 2023-09-02 MED ORDER — ACETAMINOPHEN 325 MG PO TABS: 325.0000 mg | ORAL_TABLET | Freq: Four times a day (QID) | ORAL | Status: DC | PRN

## 2023-09-02 MED ORDER — BUPIVACAINE LIPOSOME 1.3 % IJ SUSP
INTRAMUSCULAR | Status: DC | PRN
  Administered 2023-09-02: 20 mL

## 2023-09-02 MED ORDER — PHENOL 1.4 % MT LIQD: 1.0000 | OROMUCOSAL | Status: DC | PRN

## 2023-09-02 MED ORDER — HYDROMORPHONE HCL 1 MG/ML IJ SOLN
0.5000 mg | INTRAMUSCULAR | Status: DC | PRN
  Administered 2023-09-02: 0.5 mg via INTRAVENOUS
  Filled 2023-09-02: qty 1

## 2023-09-02 MED ORDER — DEXAMETHASONE SODIUM PHOSPHATE 10 MG/ML IJ SOLN
8.0000 mg | Freq: Once | INTRAMUSCULAR | Status: AC
  Administered 2023-09-02: 8 mg via INTRAVENOUS

## 2023-09-02 MED ORDER — OXYCODONE HCL 5 MG PO TABS: 5.0000 mg | ORAL_TABLET | Freq: Once | ORAL | Status: DC | PRN

## 2023-09-02 MED ORDER — FENTANYL CITRATE PF 50 MCG/ML IJ SOSY
50.0000 ug | PREFILLED_SYRINGE | INTRAMUSCULAR | Status: DC
  Administered 2023-09-02: 50 ug via INTRAVENOUS
  Filled 2023-09-02: qty 2

## 2023-09-02 MED ORDER — PRAVASTATIN SODIUM 20 MG PO TABS
80.0000 mg | ORAL_TABLET | Freq: Every day | ORAL | Status: DC
  Administered 2023-09-03 – 2023-09-05 (×3): 80 mg via ORAL
  Filled 2023-09-02 (×3): qty 4

## 2023-09-02 MED ORDER — BUPIVACAINE IN DEXTROSE 0.75-8.25 % IT SOLN
INTRATHECAL | Status: DC | PRN
  Administered 2023-09-02: 1.6 mL via INTRATHECAL

## 2023-09-02 MED ORDER — PROPOFOL 500 MG/50ML IV EMUL
INTRAVENOUS | Status: DC | PRN
  Administered 2023-09-02: 40 ug/kg/min via INTRAVENOUS

## 2023-09-02 MED ORDER — ALBUTEROL SULFATE HFA 108 (90 BASE) MCG/ACT IN AERS
INHALATION_SPRAY | RESPIRATORY_TRACT | Status: AC
  Filled 2023-09-02: qty 6.7

## 2023-09-02 MED ORDER — SODIUM CHLORIDE (PF) 0.9 % IJ SOLN
INTRAMUSCULAR | Status: AC
  Filled 2023-09-02: qty 10

## 2023-09-02 MED ORDER — FENTANYL CITRATE (PF) 250 MCG/5ML IJ SOLN
INTRAMUSCULAR | Status: DC | PRN
  Administered 2023-09-02 (×4): 12.5 ug via INTRAVENOUS

## 2023-09-02 MED ORDER — ROPIVACAINE HCL 5 MG/ML IJ SOLN
INTRAMUSCULAR | Status: DC | PRN
  Administered 2023-09-02: 20 mL via PERINEURAL

## 2023-09-02 MED ORDER — ONDANSETRON HCL 4 MG PO TABS: 4.0000 mg | ORAL_TABLET | Freq: Four times a day (QID) | ORAL | Status: DC | PRN

## 2023-09-02 MED ORDER — METOCLOPRAMIDE HCL 5 MG PO TABS: 5.0000 mg | ORAL_TABLET | Freq: Three times a day (TID) | ORAL | Status: DC | PRN

## 2023-09-02 MED ORDER — SODIUM CHLORIDE 0.9 % IR SOLN
Status: DC | PRN
  Administered 2023-09-02: 1000 mL

## 2023-09-02 MED ORDER — FLEET ENEMA RE ENEM: 1.0000 | ENEMA | Freq: Once | RECTAL | Status: DC | PRN

## 2023-09-02 MED ORDER — METHOCARBAMOL 1000 MG/10ML IJ SOLN
500.0000 mg | Freq: Four times a day (QID) | INTRAMUSCULAR | Status: DC | PRN
  Administered 2023-09-02: 500 mg via INTRAVENOUS
  Filled 2023-09-02: qty 10

## 2023-09-02 MED ORDER — AMISULPRIDE (ANTIEMETIC) 5 MG/2ML IV SOLN: 10.0000 mg | Freq: Once | INTRAVENOUS | Status: DC | PRN

## 2023-09-02 MED ORDER — BUPIVACAINE LIPOSOME 1.3 % IJ SUSP: 20.0000 mL | Freq: Once | INTRAMUSCULAR | Status: DC

## 2023-09-02 MED ORDER — PHENYLEPHRINE HCL-NACL 20-0.9 MG/250ML-% IV SOLN
INTRAVENOUS | Status: DC | PRN
  Administered 2023-09-02: 20 ug/min via INTRAVENOUS

## 2023-09-02 MED ORDER — SODIUM CHLORIDE 0.9 % IV SOLN: INTRAVENOUS | Status: AC

## 2023-09-02 MED ORDER — ORAL CARE MOUTH RINSE: 15.0000 mL | Freq: Once | OROMUCOSAL | Status: AC

## 2023-09-02 MED ORDER — MENTHOL 3 MG MT LOZG: 1.0000 | LOZENGE | OROMUCOSAL | Status: DC | PRN

## 2023-09-02 MED ORDER — OXYCODONE HCL 5 MG/5ML PO SOLN: 5.0000 mg | Freq: Once | ORAL | Status: DC | PRN

## 2023-09-02 MED ORDER — TRANEXAMIC ACID-NACL 1000-0.7 MG/100ML-% IV SOLN
1000.0000 mg | INTRAVENOUS | Status: AC
Start: 2023-09-02 — End: 2023-09-02
  Administered 2023-09-02: 1000 mg via INTRAVENOUS
  Filled 2023-09-02: qty 100

## 2023-09-02 MED ORDER — IPRATROPIUM-ALBUTEROL 0.5-2.5 (3) MG/3ML IN SOLN
RESPIRATORY_TRACT | Status: AC
  Filled 2023-09-02: qty 3

## 2023-09-02 MED ORDER — METOCLOPRAMIDE HCL 5 MG/ML IJ SOLN: 5.0000 mg | Freq: Three times a day (TID) | INTRAMUSCULAR | Status: DC | PRN

## 2023-09-02 MED ORDER — 0.9 % SODIUM CHLORIDE (POUR BTL) OPTIME
TOPICAL | Status: DC | PRN
  Administered 2023-09-02: 1000 mL

## 2023-09-02 MED ORDER — BUMETANIDE 1 MG PO TABS
2.0000 mg | ORAL_TABLET | ORAL | Status: DC
  Administered 2023-09-03 – 2023-09-05 (×2): 2 mg via ORAL
  Filled 2023-09-02 (×2): qty 2

## 2023-09-02 MED ORDER — POVIDONE-IODINE 10 % EX SWAB: 2.0000 | Freq: Once | CUTANEOUS | Status: DC

## 2023-09-02 SURGICAL SUPPLY — 45 items
ATTUNE MED DOME PAT 38 KNEE (Knees) IMPLANT
ATTUNE PS FEM RT SZ 7 CEM KNEE (Femur) IMPLANT
ATTUNE PSRP INSR SZ7 6 KNEE (Insert) IMPLANT
BAG COUNTER SPONGE SURGICOUNT (BAG) IMPLANT
BAG ZIPLOCK 12X15 (MISCELLANEOUS) ×1 IMPLANT
BASE TIBIAL ROT PLAT SZ 7 KNEE (Knees) IMPLANT
BLADE SAG 18X100X1.27 (BLADE) ×1 IMPLANT
BLADE SAGITTAL 11X90X1.07 (BLADE) IMPLANT
BLADE SAW SGTL 11.0X1.19X90.0M (BLADE) ×1 IMPLANT
BNDG ELASTIC 6INX 5YD STR LF (GAUZE/BANDAGES/DRESSINGS) ×1 IMPLANT
BOWL SMART MIX CTS (DISPOSABLE) ×1 IMPLANT
CEMENT HV SMART SET (Cement) ×2 IMPLANT
COVER SURGICAL LIGHT HANDLE (MISCELLANEOUS) ×1 IMPLANT
CUFF TRNQT CYL 34X4.125X (TOURNIQUET CUFF) ×1 IMPLANT
DERMABOND ADVANCED .7 DNX12 (GAUZE/BANDAGES/DRESSINGS) ×1 IMPLANT
DRAPE U-SHAPE 47X51 STRL (DRAPES) ×1 IMPLANT
DRSG AQUACEL AG ADV 3.5X10 (GAUZE/BANDAGES/DRESSINGS) ×1 IMPLANT
DURAPREP 26ML APPLICATOR (WOUND CARE) ×1 IMPLANT
ELECT REM PT RETURN 15FT ADLT (MISCELLANEOUS) ×1 IMPLANT
GLOVE BIO SURGEON STRL SZ 6.5 (GLOVE) IMPLANT
GLOVE BIO SURGEON STRL SZ8 (GLOVE) ×1 IMPLANT
GLOVE BIOGEL PI IND STRL 6.5 (GLOVE) IMPLANT
GLOVE BIOGEL PI IND STRL 7.0 (GLOVE) IMPLANT
GLOVE BIOGEL PI IND STRL 8 (GLOVE) ×1 IMPLANT
GOWN STRL REUS W/ TWL LRG LVL3 (GOWN DISPOSABLE) ×1 IMPLANT
HOLDER FOLEY CATH W/STRAP (MISCELLANEOUS) IMPLANT
IMMOBILIZER KNEE 20 (SOFTGOODS) ×1 IMPLANT
IMMOBILIZER KNEE 20 THIGH 36 (SOFTGOODS) ×1 IMPLANT
KIT TURNOVER KIT A (KITS) IMPLANT
MANIFOLD NEPTUNE II (INSTRUMENTS) ×1 IMPLANT
NS IRRIG 1000ML POUR BTL (IV SOLUTION) ×1 IMPLANT
PACK TOTAL KNEE CUSTOM (KITS) ×1 IMPLANT
PADDING CAST COTTON 6X4 STRL (CAST SUPPLIES) ×2 IMPLANT
PIN STEINMAN FIXATION KNEE (PIN) IMPLANT
PROTECTOR NERVE ULNAR (MISCELLANEOUS) ×1 IMPLANT
SET HNDPC FAN SPRY TIP SCT (DISPOSABLE) ×1 IMPLANT
SUT MNCRL AB 4-0 PS2 18 (SUTURE) ×1 IMPLANT
SUT STRATAFIX 0 PDS 27 VIOLET (SUTURE) ×1
SUT VIC AB 2-0 CT1 TAPERPNT 27 (SUTURE) ×3 IMPLANT
SUTURE STRATFX 0 PDS 27 VIOLET (SUTURE) ×1 IMPLANT
TIBIAL BASE ROT PLAT SZ 7 KNEE (Knees) ×1 IMPLANT
TRAY FOLEY MTR SLVR 16FR STAT (SET/KITS/TRAYS/PACK) IMPLANT
TUBE SUCTION HIGH CAP CLEAR NV (SUCTIONS) ×1 IMPLANT
WATER STERILE IRR 1000ML POUR (IV SOLUTION) ×2 IMPLANT
WRAP KNEE MAXI GEL POST OP (GAUZE/BANDAGES/DRESSINGS) ×1 IMPLANT

## 2023-09-02 NOTE — Op Note (Signed)
OPERATIVE REPORT-TOTAL KNEE ARTHROPLASTY   Pre-operative diagnosis- Osteoarthritis  Right knee(s)  Post-operative diagnosis- Osteoarthritis Right knee(s)  Procedure-  Right  Total Knee Arthroplasty  Surgeon- Gus Rankin. Jatoya Armbrister, MD  Assistant- Arcola Jansky, PA-C   Anesthesia-   Adductor canal block and spinal  EBL- 25 ml   Drains None  Tourniquet time-  Total Tourniquet Time Documented: Thigh (Right) - 40 minutes Total: Thigh (Right) - 40 minutes     Complications- None  Condition-PACU - hemodynamically stable.   Brief Clinical Note  Ryan Weeks. is a 72 y.o. year old male with end stage OA of his right knee with progressively worsening pain and dysfunction. He has constant pain, with activity and at rest and significant functional deficits with difficulties even with ADLs. He has had extensive non-op management including analgesics, injections of cortisone and viscosupplements, and home exercise program, but remains in significant pain with significant dysfunction. Radiographs show bone on bone arthritis lateral and patellofemoral. He presents now for right Total Knee Arthroplasty.     Procedure in detail---   The patient is brought into the operating room and positioned supine on the operating table. After successful administration of  Adductor canal block and spinal,   a tourniquet is placed high on the  Right thigh(s) and the lower extremity is prepped and draped in the usual sterile fashion. Time out is performed by the operating team and then the  Right lower extremity is wrapped in Esmarch, knee flexed and the tourniquet inflated to 300 mmHg.       A midline incision is made with a ten blade through the subcutaneous tissue to the level of the extensor mechanism. A fresh blade is used to make a medial parapatellar arthrotomy. Soft tissue over the proximal medial tibia is subperiosteally elevated to the joint line with a knife and into the semimembranosus bursa with a  Cobb elevator. Soft tissue over the proximal lateral tibia is elevated with attention being paid to avoiding the patellar tendon on the tibial tubercle. The patella is everted, knee flexed 90 degrees and the ACL and PCL are removed. Findings are bone on bone lateral and patellofemoral with large global osteophytes        The drill is used to create a starting hole in the distal femur and the canal is thoroughly irrigated with sterile saline to remove the fatty contents. The 5 degree Right  valgus alignment guide is placed into the femoral canal and the distal femoral cutting block is pinned to remove 9 mm off the distal femur. Resection is made with an oscillating saw.      The tibia is subluxed forward and the menisci are removed. The extramedullary alignment guide is placed referencing proximally at the medial aspect of the tibial tubercle and distally along the second metatarsal axis and tibial crest. The block is pinned to remove 2mm off the more deficient lateral  side. Resection is made with an oscillating saw. Size 7is the most appropriate size for the tibia and the proximal tibia is prepared with the modular drill and keel punch for that size.      The femoral sizing guide is placed and size 7 is most appropriate. Rotation is marked off the epicondylar axis and confirmed by creating a rectangular flexion gap at 90 degrees. The size 7 cutting block is pinned in this rotation and the anterior, posterior and chamfer cuts are made with the oscillating saw. The intercondylar block is then placed and that  cut is made.      Trial size 7 tibial component, trial size 7 posterior stabilized femur and a 6  mm posterior stabilized rotating platform insert trial is placed. Full extension is achieved with excellent varus/valgus and anterior/posterior balance throughout full range of motion. The patella is everted and thickness measured to be 24  mm. Free hand resection is taken to 14 mm, a 38 template is placed, lug  holes are drilled, trial patella is placed, and it tracks normally. Osteophytes are removed off the posterior femur with the trial in place. All trials are removed and the cut bone surfaces prepared with pulsatile lavage. Cement is mixed and once ready for implantation, the size 7 tibial implant, size  7 posterior stabilized femoral component, and the size 38 patella are cemented in place and the patella is held with the clamp. The trial insert is placed and the knee held in full extension. The Exparel (20 ml mixed with 60 ml saline) is injected into the extensor mechanism, posterior capsule, medial and lateral gutters and subcutaneous tissues.  All extruded cement is removed and once the cement is hard the permanent 6 mm posterior stabilized rotating platform insert is placed into the tibial tray.      The wound is copiously irrigated with saline solution and the extensor mechanism closed with # 0 Stratofix suture. The tourniquet is released for a total tourniquet time of 40  minutes. Flexion against gravity is 130 degrees and the patella tracks normally. Subcutaneous tissue is closed with 2.0 vicryl and subcuticular with running 4.0 Monocryl. The incision is cleaned and dried and steri-strips and a bulky sterile dressing are applied. The limb is placed into a knee immobilizer and the patient is awakened and transported to recovery in stable condition.      Please note that a surgical assistant was a medical necessity for this procedure in order to perform it in a safe and expeditious manner. Surgical assistant was necessary to retract the ligaments and vital neurovascular structures to prevent injury to them and also necessary for proper positioning of the limb to allow for anatomic placement of the prosthesis.   Gus Rankin Bentley Haralson, MD    09/02/2023, 2:22 PM

## 2023-09-02 NOTE — Evaluation (Signed)
Physical Therapy Evaluation Patient Details Name: Ryan Weeks. MRN: [id number] DOB: November 20, 1950 Today's Date: 09/02/2023  History of Present Illness  72 yo male presents to therapy s/p R TKA on 09/02/2023 due to failure of conservative measures. Pt PMH includes but is not limited to: colitis, HTN, tobacco abuse, diplopia, HLD, s/p AVR, PAF, COPD, GERD, and lumbar disc surgery.  Clinical Impression   Ryan Boldon. is a 72 y.o. male POD 0 s/p T TKA. Patient reports IND with mobility at baseline. Patient is now limited by functional impairments (see PT problem list below) and requires CGA for bed mobility and max for safety to complete SPT, min A for sit to stand  from EOB. Pt  unable to safely ambulate at time of eval attributed to slow regression of anesthesia with noted B LE instability.  Patient will benefit from continued skilled PT interventions to address impairments and progress towards PLOF. Acute PT will follow to progress mobility and stair training in preparation for safe discharge home with strong family support and OPPT services.       If plan is discharge home, recommend the following: A little help with walking and/or transfers;A little help with bathing/dressing/bathroom;Assistance with cooking/housework;Assist for transportation;Help with stairs or ramp for entrance   Can travel by private vehicle        Equipment Recommendations None recommended by PT  Recommendations for Other Services       Functional Status Assessment Patient has had a recent decline in their functional status and demonstrates the ability to make significant improvements in function in a reasonable and predictable amount of time.     Precautions / Restrictions Precautions Precautions: Fall;Knee Restrictions Weight Bearing Restrictions: No      Mobility  Bed Mobility Overal bed mobility: Needs Assistance Bed Mobility: Supine to Sit     Supine to sit: Contact guard, HOB elevated, Used  rails     General bed mobility comments: min cues and increased time    Transfers Overall transfer level: Needs assistance Equipment used: Rolling walker (2 wheels) Transfers: Sit to/from Stand, Bed to chair/wheelchair/BSC Sit to Stand: Min assist Stand pivot transfers: Max assist         General transfer comment: pt indicated buttock abn sensation seated EOB and once in standing with min A and pt able to push with L UE and maintain static standing balance with CGA and B UE support, once pt began to pivot L to reclier pt stated his legs gave out on him requiring max A to safely assist pt to recliner, once seated pt able to reposition. PT noted L LE edema localized and knee and L LE appeared to be result of difficulty with safe pivot to recliner.    Ambulation/Gait               General Gait Details: NT due to B LE instabillity attribtued to slow regression of anesthesia.  Stairs            Wheelchair Mobility     Tilt Bed    Modified Rankin (Stroke Patients Only)       Balance                                             Pertinent Vitals/Pain Pain Assessment Pain Assessment: 0-10 Pain Score: 4  Pain Location: R knee Pain Descriptors /  Indicators: Aching, Constant, Discomfort, Operative site guarding Pain Intervention(s): Limited activity within patient's tolerance, Monitored during session, Premedicated before session, Repositioned, Ice applied    Home Living Family/patient expects to be discharged to:: Private residence Living Arrangements: Alone Available Help at Discharge: Family Type of Home: House Home Access: Stairs to enter Entrance Stairs-Rails: Right Entrance Stairs-Number of Steps: 3 + 1 (from garage, first 3 have handrail, last threshold to enter home)   Home Layout: One level Home Equipment: Agricultural consultant (2 wheels)      Prior Function Prior Level of Function : Independent/Modified Independent;Driving              Mobility Comments: IND no AD for all ALDs, self care tasks and IADLs       Extremity/Trunk Assessment        Lower Extremity Assessment Lower Extremity Assessment: RLE deficits/detail RLE Deficits / Details: ankle DF 5/5, PF 3/5, SLR < 10 degree lag RLE Sensation: decreased light touch;decreased proprioception (groin and buttock)    Cervical / Trunk Assessment Cervical / Trunk Assessment: Back Surgery  Communication   Communication Communication: No apparent difficulties  Cognition Arousal: Alert Behavior During Therapy: WFL for tasks assessed/performed Overall Cognitive Status: Within Functional Limits for tasks assessed                                          General Comments      Exercises     Assessment/Plan    PT Assessment Patient needs continued PT services  PT Problem List Decreased strength;Decreased range of motion;Decreased activity tolerance;Decreased balance;Decreased mobility;Decreased coordination;Pain       PT Treatment Interventions DME instruction;Gait training;Stair training;Functional mobility training;Therapeutic activities;Therapeutic exercise;Balance training;Neuromuscular re-education;Patient/family education;Modalities    PT Goals (Current goals can be found in the Care Plan section)  Acute Rehab PT Goals Patient Stated Goal: to be able to get back on the golf course PT Goal Formulation: With patient Time For Goal Achievement: 09/16/23 Potential to Achieve Goals: Good    Frequency 7X/week     Co-evaluation               AM-PAC PT "6 Clicks" Mobility  Outcome Measure Help needed turning from your back to your side while in a flat bed without using bedrails?: A Little Help needed moving from lying on your back to sitting on the side of a flat bed without using bedrails?: A Little Help needed moving to and from a bed to a chair (including a wheelchair)?: A Lot Help needed standing up from a chair using your  arms (e.g., wheelchair or bedside chair)?: A Lot Help needed to walk in hospital room?: Total Help needed climbing 3-5 steps with a railing? : Total 6 Click Score: 12    End of Session Equipment Utilized During Treatment: Gait belt Activity Tolerance: Treatment limited secondary to medical complications (Comment);Patient limited by pain;Patient limited by fatigue (slow regresson of anethesia. pt focused on eating dinner) Patient left: in chair;with call bell/phone within reach;with family/visitor present   PT Visit Diagnosis: Unsteadiness on feet (R26.81);Other abnormalities of gait and mobility (R26.89);Muscle weakness (generalized) (M62.81);Pain;Difficulty in walking, not elsewhere classified (R26.2) Pain - Right/Left: Right Pain - part of body: Knee;Leg    Time: 1191-4782 PT Time Calculation (min) (ACUTE ONLY): 24 min   Charges:   PT Evaluation $PT Eval Low Complexity: 1 Low PT Treatments $Therapeutic Activity: 8-22  mins PT General Charges $$ ACUTE PT VISIT: 1 Visit         Ryan Weeks, PT Acute Rehab   Ryan Weeks 09/02/2023, 6:41 PM

## 2023-09-02 NOTE — Discharge Instructions (Signed)
Ryan Arabian, MD Total Joint Specialist [facility] [place] [address], [address] [place], [place] [address] [phone]  TOTAL KNEE REPLACEMENT POSTOPERATIVE DIRECTIONS    Knee Rehabilitation, Guidelines Following Surgery  Results after knee surgery are often greatly improved when you follow the exercise, range of motion and muscle strengthening exercises prescribed by your doctor. Safety measures are also important to protect the knee from further injury. If any of these exercises cause you to have increased pain or swelling in your knee joint, decrease the amount until you are comfortable again and slowly increase them. If you have problems or questions, call your caregiver or physical therapist for advice.   BLOOD CLOT PREVENTION Take an 81 mg Aspirin two times a day for three weeks following surgery. Then take an 81 mg Aspirin once a day for three weeks. Then discontinue Aspirin. You may resume your vitamins/supplements upon discharge from the hospital. Do not take any NSAIDs (Advil, Aleve, Ibuprofen, Meloxicam, etc.) until you are 3 weeks out from surgery.  HOME CARE INSTRUCTIONS  Remove items at home which could result in a fall. This includes throw rugs or furniture in walking pathways.  ICE to the affected knee as much as tolerated. Icing helps control swelling. If the swelling is well controlled you will be more comfortable and rehab easier. Continue to use ice on the knee for pain and swelling from surgery. You may notice swelling that will progress down to the foot and ankle. This is normal after surgery. Elevate the leg when you are not up walking on it.    Continue to use the breathing machine which will help keep your temperature down. It is common for your temperature to cycle up and down following surgery, especially at night when you are not up moving around and exerting yourself. The breathing machine keeps your lungs expanded and your temperature down. Do  not place pillow under the operative knee, focus on keeping the knee straight while resting  DIET You may resume your previous home diet once you are discharged from the hospital.  DRESSING / WOUND CARE / SHOWERING Keep your bulky bandage on for 2 days. On the third post-operative day you may remove the Ace bandage and gauze. There is a waterproof adhesive bandage on your skin which will stay in place until your first follow-up appointment. Once you remove this you will not need to place another bandage You may begin showering 3 days following surgery, but do not submerge the incision under water.  ACTIVITY For the first 5 days, the key is rest and control of pain and swelling Do your home exercises twice a day starting on post-operative day 3. On the days you go to physical therapy, just do the home exercises once that day. You should rest, ice and elevate the leg for 50 minutes out of every hour. Get up and walk/stretch for 10 minutes per hour. After 5 days you can increase your activity slowly as tolerated. Walk with your walker as instructed. Use the walker until you are comfortable transitioning to a cane. Walk with the cane in the opposite hand of the operative leg. You may discontinue the cane once you are comfortable and walking steadily. Avoid periods of inactivity such as sitting longer than an hour when not asleep. This helps prevent blood clots.  You may discontinue the knee immobilizer once you are able to perform a straight leg raise while lying down. You may resume a sexual relationship in one month  or when given the OK by your doctor.  You may return to work once you are cleared by your doctor.  Do not drive a car for 6 weeks or until released by your surgeon.  Do not drive while taking narcotics.  TED HOSE STOCKINGS Wear the elastic stockings on both legs for three weeks following surgery during the day. You may remove them at night for sleeping.  WEIGHT BEARING Weight  bearing as tolerated with assist device (walker, cane, etc) as directed, use it as long as suggested by your surgeon or therapist, typically at least 4-6 weeks.  POSTOPERATIVE CONSTIPATION PROTOCOL Constipation - defined medically as fewer than three stools per week and severe constipation as less than one stool per week.  One of the most common issues patients have following surgery is constipation.  Even if you have a regular bowel pattern at home, your normal regimen is likely to be disrupted due to multiple reasons following surgery.  Combination of anesthesia, postoperative narcotics, change in appetite and fluid intake all can affect your bowels.  In order to avoid complications following surgery, here are some recommendations in order to help you during your recovery period.  Colace (docusate) - Pick up an over-the-counter form of Colace or another stool softener and take twice a day as long as you are requiring postoperative pain medications.  Take with a full glass of water daily.  If you experience loose stools or diarrhea, hold the colace until you stool forms back up. If your symptoms do not get better within 1 week or if they get worse, check with your doctor. Dulcolax (bisacodyl) - Pick up over-the-counter and take as directed by the product packaging as needed to assist with the movement of your bowels.  Take with a full glass of water.  Use this product as needed if not relieved by Colace only.  MiraLax (polyethylene glycol) - Pick up over-the-counter to have on hand. MiraLax is a solution that will increase the amount of water in your bowels to assist with bowel movements.  Take as directed and can mix with a glass of water, juice, soda, coffee, or tea. Take if you go more than two days without a movement. Do not use MiraLax more than once per day. Call your doctor if you are still constipated or irregular after using this medication for 7 days in a row.  If you continue to have problems  with postoperative constipation, please contact the office for further assistance and recommendations.  If you experience "the worst abdominal pain ever" or develop nausea or vomiting, please contact the office immediatly for further recommendations for treatment.  ITCHING If you experience itching with your medications, try taking only a single pain pill, or even half a pain pill at a time.  You can also use Benadryl over the counter for itching or also to help with sleep.   MEDICATIONS See your medication summary on the "After Visit Summary" that the nursing staff will review with you prior to discharge.  You may have some home medications which will be placed on hold until you complete the course of blood thinner medication.  It is important for you to complete the blood thinner medication as prescribed by your surgeon.  Continue your approved medications as instructed at time of discharge.  PRECAUTIONS If you experience chest pain or shortness of breath - call 911 immediately for transfer to the hospital emergency department.  If you develop a fever greater that 101 F,  purulent drainage from wound, increased redness or drainage from wound, foul odor from the wound/dressing, or calf pain - CONTACT YOUR SURGEON.                                                   FOLLOW-UP APPOINTMENTS Make sure you keep all of your appointments after your operation with your surgeon and caregivers. You should call the office at the above phone number and make an appointment for approximately two weeks after the date of your surgery or on the date instructed by your surgeon outlined in the "After Visit Summary".  RANGE OF MOTION AND STRENGTHENING EXERCISES  Rehabilitation of the knee is important following a knee injury or an operation. After just a few days of immobilization, the muscles of the thigh which control the knee become weakened and shrink (atrophy). Knee exercises are designed to build up the tone and  strength of the thigh muscles and to improve knee motion. Often times heat used for twenty to thirty minutes before working out will loosen up your tissues and help with improving the range of motion but do not use heat for the first two weeks following surgery. These exercises can be done on a training (exercise) mat, on the floor, on a table or on a bed. Use what ever works the best and is most comfortable for you Knee exercises include:  Leg Lifts - While your knee is still immobilized in a splint or cast, you can do straight leg raises. Lift the leg to 60 degrees, hold for 3 sec, and slowly lower the leg. Repeat 10-20 times 2-3 times daily. Perform this exercise against resistance later as your knee gets better.  Quad and Hamstring Sets - Tighten up the muscle on the front of the thigh (Quad) and hold for 5-10 sec. Repeat this 10-20 times hourly. Hamstring sets are done by pushing the foot backward against an object and holding for 5-10 sec. Repeat as with quad sets.  Leg Slides: Lying on your back, slowly slide your foot toward your buttocks, bending your knee up off the floor (only go as far as is comfortable). Then slowly slide your foot back down until your leg is flat on the floor again. Angel Wings: Lying on your back spread your legs to the side as far apart as you can without causing discomfort.  A rehabilitation program following serious knee injuries can speed recovery and prevent re-injury in the future due to weakened muscles. Contact your doctor or a physical therapist for more information on knee rehabilitation.   POST-OPERATIVE OPIOID TAPER INSTRUCTIONS: It is important to wean off of your opioid medication as soon as possible. If you do not need pain medication after your surgery it is ok to stop day one. Opioids include: Codeine, Hydrocodone(Norco, Vicodin), Oxycodone(Percocet, oxycontin) and hydromorphone amongst others.  Long term and even short term use of opiods can  cause: Increased pain response Dependence Constipation Depression Respiratory depression And more.  Withdrawal symptoms can include Flu like symptoms Nausea, vomiting And more Techniques to manage these symptoms Hydrate well Eat regular healthy meals Stay active Use relaxation techniques(deep breathing, meditating, yoga) Do Not substitute Alcohol to help with tapering If you have been on opioids for less than two weeks and do not have pain than it is ok to stop all together.  Plan  to wean off of opioids This plan should start within one week post op of your joint replacement. Maintain the same interval or time between taking each dose and first decrease the dose.  Cut the total daily intake of opioids by one tablet each day Next start to increase the time between doses. The last dose that should be eliminated is the evening dose.   IF YOU ARE TRANSFERRED TO A SKILLED REHAB FACILITY If the patient is transferred to a skilled rehab facility following release from the hospital, a list of the current medications will be sent to the facility for the patient to continue.  When discharged from the skilled rehab facility, please have the facility set up the patient's Home Health Physical Therapy prior to being released. Also, the skilled facility will be responsible for providing the patient with their medications at time of release from the facility to include their pain medication, the muscle relaxants, and their blood thinner medication. If the patient is still at the rehab facility at time of the two week follow up appointment, the skilled rehab facility will also need to assist the patient in arranging follow up appointment in our office and any transportation needs.  MAKE SURE YOU:  Understand these instructions.  Get help right away if you are not doing well or get worse.   DENTAL ANTIBIOTICS:  In most cases prophylactic antibiotics for Dental procdeures after total joint surgery are  not necessary.  Exceptions are as follows:  1. History of prior total joint infection  2. Severely immunocompromised (Organ Transplant, cancer chemotherapy, Rheumatoid biologic meds such as Humera)  3. Poorly controlled diabetes (A1C &gt; 8.0, blood glucose over 200)  If you have one of these conditions, contact your surgeon for an antibiotic prescription, prior to your dental procedure.    Pick up stool softner and laxative for home use following surgery while on pain medications. Do not submerge incision under water. Please use good hand washing techniques while changing dressing each day. May shower starting three days after surgery. Please use a clean towel to pat the incision dry following showers. Continue to use ice for pain and swelling after surgery. Do not use any lotions or creams on the incision until instructed by your surgeon.  

## 2023-09-02 NOTE — Anesthesia Preprocedure Evaluation (Addendum)
Anesthesia Evaluation  Patient identified by MRN, date of birth, ID band Patient awake    Reviewed: Allergy & Precautions, NPO status , Patient's Chart, lab work & pertinent test results  History of Anesthesia Complications (+) Family history of anesthesia reaction and history of anesthetic complications (son trouble going under)  Airway Mallampati: III  TM Distance: >3 FB Neck ROM: Full    Dental  (+) Dental Advisory Given, Edentulous Lower   Pulmonary neg shortness of breath, neg sleep apnea, COPD, neg recent URI, Current Smoker   Pulmonary exam normal breath sounds clear to auscultation       Cardiovascular hypertension (losartan, metoprolol), Pt. on medications and Pt. on home beta blockers (-) angina + Peripheral Vascular Disease and +CHF (EF 45%)  (-) Past MI, (-) Cardiac Stents, (-) CABG and (-) Orthopnea + dysrhythmias (RBBB) Atrial Fibrillation + Valvular Problems/Murmurs (h/o bicuspid aortic valve s/p AVR)  Rhythm:Regular Rate:Normal  HLD  TTE 06/03/2023: INTERPRETATION  MILD LV SYSTOLIC DYSFUNCTION (See above)  MODERATE RV SYSTOLIC DYSFUNCTION (See above)  MILD VALVULAR REGURGITATION (See above)  LA MODERATELY ENLARGED  MODERATE PLEURAL EFFUSION  MILD PERICARDIAL EFFUSION  RA MILDLY ENLARGED  MILD AR, MR, TR, PR  s/p bioprosthetic AVR  EF 45%     Neuro/Psych negative neurological ROS     GI/Hepatic ,GERD  ,,(+)     substance abuse (drinks 2-3 beers/day)  alcohol use  Endo/Other  Pre-diabetes  Renal/GU negative Renal ROS     Musculoskeletal  (+) Arthritis , Osteoarthritis,    Abdominal   Peds  Hematology negative hematology ROS (+) Lab Results      Component                Value               Date                      WBC                      8.0                 08/29/2023                HGB                      13.3                08/29/2023                HCT                      42.0                 08/29/2023                MCV                      103.2 (H)           08/29/2023                PLT                      176                 08/29/2023  Anesthesia Other Findings Patient abdomen appears swollen. Patient states this is his baseline. He had a CHF exacerbation a few months ago and currently takes Bumex. He denies orthopnea, PND, and chest pain.  Reproductive/Obstetrics                              Anesthesia Physical Anesthesia Plan  ASA: 3  Anesthesia Plan: MAC, Regional and Spinal   Post-op Pain Management: Regional block* and Tylenol PO (pre-op)*   Induction: Intravenous  PONV Risk Score and Plan: 0 and Ondansetron, Dexamethasone, Propofol infusion, TIVA and Treatment may vary due to age or medical condition  Airway Management Planned: Natural Airway and Simple Face Mask  Additional Equipment:   Intra-op Plan:   Post-operative Plan:   Informed Consent: I have reviewed the patients History and Physical, chart, labs and discussed the procedure including the risks, benefits and alternatives for the proposed anesthesia with the patient or authorized representative who has indicated his/her understanding and acceptance.     Dental advisory given  Plan Discussed with: CRNA and Anesthesiologist  Anesthesia Plan Comments: (Discussed potential risks of nerve blocks including, but not limited to, infection, bleeding, nerve damage, seizures, pneumothorax, respiratory depression, and potential failure of the block. Alternatives to nerve blocks discussed. All questions answered.  I have discussed risks of neuraxial anesthesia including but not limited to infection, bleeding, nerve injury, back pain, headache, seizures, and failure of block. Patient denies bleeding disorders and is not currently anticoagulated. Labs have been reviewed. Risks and benefits discussed. All patient's questions answered.   Discussed with patient risks  of MAC including, but not limited to, minor pain or discomfort, hearing people in the room, and possible need for backup general anesthesia. Risks for general anesthesia also discussed including, but not limited to, sore throat, hoarse voice, chipped/damaged teeth, injury to vocal cords, nausea and vomiting, allergic reactions, lung infection, heart attack, stroke, and death. All questions answered. )         Anesthesia Quick Evaluation

## 2023-09-02 NOTE — Transfer of Care (Signed)
Immediate Anesthesia Transfer of Care Note  Patient: Ryan Weeks.  Procedure(s) Performed: TOTAL KNEE ARTHROPLASTY (Right: Knee)  Patient Location: PACU  Anesthesia Type:MAC, Regional, and Spinal  Level of Consciousness: drowsy  Airway & Oxygen Therapy: Patient Spontanous Breathing and Patient connected to face mask oxygen  Post-op Assessment: Report given to RN and Post -op Vital signs reviewed and stable  Post vital signs: Reviewed and stable  Last Vitals:  Vitals Value Taken Time  BP 124/58 09/02/23 1451  Temp    Pulse 60 09/02/23 1454  Resp 13 09/02/23 1454  SpO2 100 % 09/02/23 1454  Vitals shown include unfiled device data.  Last Pain:  Vitals:   09/02/23 1057  TempSrc:   PainSc: 0-No pain         Complications: No notable events documented.

## 2023-09-02 NOTE — Anesthesia Postprocedure Evaluation (Signed)
Anesthesia Post Note  Patient: Ryan Weeks.  Procedure(s) Performed: TOTAL KNEE ARTHROPLASTY (Right: Knee)     Patient location during evaluation: PACU Anesthesia Type: Regional Level of consciousness: awake Pain management: pain level controlled Vital Signs Assessment: post-procedure vital signs reviewed and stable Respiratory status: spontaneous breathing, respiratory function stable and nonlabored ventilation Cardiovascular status: blood pressure returned to baseline and stable Postop Assessment: no headache, no backache and no apparent nausea or vomiting Anesthetic complications: no   No notable events documented.  Last Vitals:  Vitals:   09/02/23 1615 09/02/23 1646  BP: (!) 115/54 (!) 125/54  Pulse: (!) 51 (!) 51  Resp: 14 14  Temp:  (!) 36.4 C  SpO2: 96% (!) 63%    Last Pain:  Vitals:   09/02/23 1615  TempSrc:   PainSc: 0-No pain                 Linton Rump

## 2023-09-02 NOTE — Progress Notes (Signed)
Orthopedic Tech Progress Note Patient Details:  Ryan Weeks July 05, 1951 [id number]  Applied CPM CPM Right Knee CPM Right Knee: On Right Knee Flexion (Degrees): 40 Right Knee Extension (Degrees): 10  Post Interventions Patient Tolerated: Well Instructions Provided: Care of device, Adjustment of device  Sherilyn Banker 09/02/2023, 3:45 PM

## 2023-09-02 NOTE — Anesthesia Procedure Notes (Signed)
Procedure Name: MAC Date/Time: 09/02/2023 1:23 PM  Performed by: Floydene Flock, CRNAPre-anesthesia Checklist: Patient identified, Emergency Drugs available, Suction available, Patient being monitored and Timeout performed Patient Re-evaluated:Patient Re-evaluated prior to induction Oxygen Delivery Method: Simple face mask

## 2023-09-02 NOTE — Anesthesia Procedure Notes (Signed)
Anesthesia Regional Block: Adductor canal block   Pre-Anesthetic Checklist: , timeout performed,  Correct Patient, Correct Site, Correct Laterality,  Correct Procedure, Correct Position, site marked,  Risks and benefits discussed,  Surgical consent,  Pre-op evaluation,  At surgeon's request and post-op pain management  Laterality: Right  Prep: chloraprep       Needles:  Injection technique: Single-shot  Needle Type: Echogenic Stimulator Needle     Needle Length: 9cm  Needle Gauge: 21     Additional Needles:   Procedures:,,,, ultrasound used (permanent image in chart),,    Narrative:  Start time: 09/02/2023 11:50 AM End time: 09/02/2023 11:52 AM Injection made incrementally with aspirations every 5 mL.  Performed by: Personally  Anesthesiologist: Linton Rump, MD  Additional Notes: Discussed risks and benefits of nerve block including, but not limited to, prolonged and/or permanent nerve injury involving sensory and/or motor function. Monitors were applied and a time-out was performed. The nerve and associated structures were visualized under ultrasound guidance. After negative aspiration, local anesthetic was slowly injected around the nerve. There was no evidence of high pressure during the procedure. There were no paresthesias. VSS remained stable and the patient tolerated the procedure well.

## 2023-09-02 NOTE — Interval H&P Note (Signed)
History and Physical Interval Note:  09/02/2023 11:02 AM  Ryan Weeks.  has presented today for surgery, with the diagnosis of right knee osteoarthritis.  The various methods of treatment have been discussed with the patient and family. After consideration of risks, benefits and other options for treatment, the patient has consented to  Procedure(s): TOTAL KNEE ARTHROPLASTY (Right) as a surgical intervention.  The patient's history has been reviewed, patient examined, no change in status, stable for surgery.  I have reviewed the patient's chart and labs.  Questions were answered to the patient's satisfaction.     [name]

## 2023-09-02 NOTE — Anesthesia Procedure Notes (Signed)
Spinal  Patient location during procedure: OR Start time: 09/02/2023 1:11 PM End time: 09/02/2023 1:14 PM Reason for block: surgical anesthesia Staffing Performed: anesthesiologist  Anesthesiologist: Linton Rump, MD Performed by: Linton Rump, MD Authorized by: Linton Rump, MD   Preanesthetic Checklist Completed: patient identified, IV checked, site marked, risks and benefits discussed, surgical consent, monitors and equipment checked, pre-op evaluation and timeout performed Spinal Block Patient position: sitting Prep: DuraPrep Patient monitoring: blood pressure and continuous pulse ox Approach: midline Location: L3-4 Injection technique: single-shot Needle Needle type: Pencan  Needle gauge: 24 G Needle length: 9 cm Additional Notes Risks and benefits of neuraxial anesthesia including, but not limited to, infection, bleeding, local anesthetic toxicity, headache, hypotension, back pain, block failure, etc. were discussed with the patient. The patient expressed understanding and consented to the procedure. I confirmed that the patient has no bleeding disorders and is not taking blood thinners. I confirmed the patient's last platelet count with the nurse. Monitors were applied. A time-out was performed immediately prior to the procedure. Sterile technique was used throughout the whole procedure.   1 attempt(s)

## 2023-09-02 NOTE — Progress Notes (Signed)
Patient arrived to PACU minimally responsive, guppy breathing.  MDA notified and arrived at bedside.  MDA wants stat chest x ray and duo neb.  Will administer and notify MDA for further changes.

## 2023-09-03 ENCOUNTER — Observation Stay (HOSPITAL_COMMUNITY): Payer: Medicare HMO

## 2023-09-03 LAB — BASIC METABOLIC PANEL
Anion gap: 11 (ref 5–15)
BUN: 27 mg/dL — ABNORMAL HIGH (ref 8–23)
CO2: 29 mmol/L (ref 22–32)
Calcium: 8.4 mg/dL — ABNORMAL LOW (ref 8.9–10.3)
Chloride: 97 mmol/L — ABNORMAL LOW (ref 98–111)
Creatinine, Ser: 1.02 mg/dL (ref 0.61–1.24)
GFR, Estimated: >60 mL/min (ref >60)
Glucose, Bld: 177 mg/dL — ABNORMAL HIGH (ref 70–99)
Potassium: 3.9 mmol/L (ref 3.5–5.1)
Sodium: 137 mmol/L (ref 135–145)

## 2023-09-03 LAB — CBC
HCT: 35.4 % — ABNORMAL LOW (ref 39.0–52.0)
Hemoglobin: 10.9 g/dL — ABNORMAL LOW (ref 13.0–17.0)
MCH: 32.3 pg (ref 26.0–34.0)
MCHC: 30.8 g/dL (ref 30.0–36.0)
MCV: 105 fL — ABNORMAL HIGH (ref 80.0–100.0)
Platelets: 148 10*3/uL — ABNORMAL LOW (ref 150–400)
RBC: 3.37 MIL/uL — ABNORMAL LOW (ref 4.22–5.81)
RDW: 16.5 % — ABNORMAL HIGH (ref 11.5–15.5)
WBC: 7.3 10*3/uL (ref 4.0–10.5)
nRBC: 0 % (ref 0.0–0.2)

## 2023-09-03 MED ORDER — BUMETANIDE 1 MG PO TABS
1.0000 mg | ORAL_TABLET | Freq: Once | ORAL | Status: AC
  Administered 2023-09-03: 1 mg via ORAL
  Filled 2023-09-03: qty 1

## 2023-09-03 MED ORDER — ONDANSETRON HCL 4 MG PO TABS: 4.0000 mg | ORAL_TABLET | Freq: Four times a day (QID) | ORAL | 0 refills | Status: DC | PRN

## 2023-09-03 MED ORDER — ASPIRIN 81 MG PO CHEW: 81.0000 mg | CHEWABLE_TABLET | Freq: Two times a day (BID) | ORAL | 0 refills | Status: AC

## 2023-09-03 MED ORDER — OXYCODONE HCL 5 MG PO TABS: 5.0000 mg | ORAL_TABLET | Freq: Four times a day (QID) | ORAL | 0 refills | Status: DC | PRN

## 2023-09-03 MED ORDER — METHOCARBAMOL 500 MG PO TABS: 500.0000 mg | ORAL_TABLET | Freq: Four times a day (QID) | ORAL | 0 refills | Status: DC | PRN

## 2023-09-03 NOTE — Progress Notes (Signed)
Physical Therapy Treatment Patient Details Name: Ryan Weeks. MRN: [id number] DOB: 1951-09-13 Today's Date: 09/03/2023   History of Present Illness 72 yo male presents to therapy s/p R TKA on 09/02/2023 due to failure of conservative measures. Pt PMH includes but is not limited to: colitis, HTN, tobacco abuse, diplopia, HLD, s/p AVR, PAF, COPD, GERD, and lumbar disc surgery.    PT Comments  Patient  ambulated on 2 L [place] with SPO2  at 88%, on RA SPO2 dropped to 85 with ambulation. Continue PT. RNand PA aware. Will check sats for ambulation again next visit.     If plan is discharge home, recommend the following: A little help with walking and/or transfers;A little help with bathing/dressing/bathroom;Assistance with cooking/housework;Assist for transportation;Help with stairs or ramp for entrance   Can travel by private vehicle        Equipment Recommendations  None recommended by PT    Recommendations for Other Services       Precautions / Restrictions Precautions Precautions: Fall;Knee Precaution Comments: monitor sats Restrictions Weight Bearing Restrictions: No     Mobility  Bed Mobility               General bed mobility comments: in reclienr    Transfers Overall transfer level: Needs assistance Equipment used: Rolling walker (2 wheels) Transfers: Sit to/from Stand Sit to Stand: Contact guard assist           General transfer comment: cues for safety and right LE position    Ambulation/Gait Ambulation/Gait assistance: Contact guard assist Gait Distance (Feet): 60 Feet Assistive device: Rolling walker (2 wheels) Gait Pattern/deviations: Step-through pattern Gait velocity: decr     General Gait Details: cues for sequence   Stairs Stairs: Yes Stairs assistance: Contact guard assist Stair Management: One rail Right, Step to pattern, Forwards   General stair comments: practices 2 and 3  with 1 rail and  cane but then disreguarded cane once  stepping up onto step, hel both rails even with  instruction to use cane   Wheelchair Mobility     Tilt Bed    Modified Rankin (Stroke Patients Only)       Balance Overall balance assessment: Mild deficits observed, not formally tested                                          Cognition Arousal: Alert Behavior During Therapy: WFL for tasks assessed/performed Overall Cognitive Status: Within Functional Limits for tasks assessed                                 General Comments: adamant to go home and not on supplemental oxygen        Exercises Total Joint Exercises Quad Sets: AROM, Both, 10 reps Straight Leg Raises: AROM, Right Long Arc Quad: AROM, Right, 10 reps Goniometric ROM: 10-80 right knee flex    General Comments        Pertinent Vitals/Pain Pain Assessment Pain Score: 6  Pain Location: R knee Pain Descriptors / Indicators: Aching, Constant, Discomfort, Operative site guarding Pain Intervention(s): Monitored during session, Premedicated before session    Home Living                          Prior Function  PT Goals (current goals can now be found in the care plan section) Progress towards PT goals: Progressing toward goals    Frequency    7X/week      PT Plan      Co-evaluation              AM-PAC PT "6 Clicks" Mobility   Outcome Measure  Help needed turning from your back to your side while in a flat bed without using bedrails?: None Help needed moving from lying on your back to sitting on the side of a flat bed without using bedrails?: A Little Help needed moving to and from a bed to a chair (including a wheelchair)?: A Little Help needed standing up from a chair using your arms (e.g., wheelchair or bedside chair)?: A Little Help needed to walk in hospital room?: A Little Help needed climbing 3-5 steps with a railing? : A Little 6 Click Score: 19    End of Session Equipment  Utilized During Treatment: Oxygen Activity Tolerance: Patient tolerated treatment well Patient left: in chair;with call bell/phone within reach;with family/visitor present Nurse Communication: Mobility status (desaturation) PT Visit Diagnosis: Unsteadiness on feet (R26.81);Other abnormalities of gait and mobility (R26.89);Muscle weakness (generalized) (M62.81);Pain;Difficulty in walking, not elsewhere classified (R26.2) Pain - Right/Left: Right Pain - part of body: Knee;Leg     Time: 1240-1300 PT Time Calculation (min) (ACUTE ONLY): 20 min  Charges:    $Gait Training: 8-22 mins $Therapeutic Exercise: 8-22 mins PT General Charges $$ ACUTE PT VISIT: 1 Visit                     Blanchard Kelch PT Acute Rehabilitation Services Office [phone] Weekend pager-[phone]    Rada Hay 09/03/2023, 5:11 PM

## 2023-09-03 NOTE — Care Management Obs Status (Signed)
MEDICARE OBSERVATION STATUS NOTIFICATION   Patient Details  Name: Ryan Weeks. MRN: [id number] Date of Birth: August 30, 1951   Medicare Observation Status Notification Given:  Yes  verbal  Amada Jupiter, LCSW 09/03/2023, 3:39 PM

## 2023-09-03 NOTE — Progress Notes (Signed)
   Subjective: 1 Day Post-Op Procedure(s) (LRB): TOTAL KNEE ARTHROPLASTY (Right) Patient reports pain as 10 on 0-10 scale.   Patient seen in rounds by Dr. Lequita Halt. Patient is well, and has had no acute complaints or problems No issues overnight. Denies chest pain, SOB, or calf pain. Foley catheter removed this AM.  We will continue therapy today  Objective: Vital signs in last 24 hours: Temp:  [97.5 F (36.4 C)-98.3 F (36.8 C)] 97.8 F (36.6 C) (12/10 0552) Pulse Rate:  [38-68] 62 (12/10 0552) Resp:  [13-28] 18 (12/10 0552) BP: (115-144)/(54-96) 118/59 (12/10 0552) SpO2:  [63 %-100 %] 90 % (12/10 0750) Weight:  [85.7 kg] 85.7 kg (12/09 1057)  Intake/Output from previous day:  Intake/Output Summary (Last 24 hours) at 09/03/2023 0830 Last data filed at 09/03/2023 0600 Gross per 24 hour  Intake 2715.9 ml  Output 455 ml  Net 2260.9 ml     Intake/Output this shift: No intake/output data recorded.  Labs: Recent Labs    09/03/23 0314  HGB 10.9*   Recent Labs    09/03/23 0314  WBC 7.3  RBC 3.37*  HCT 35.4*  PLT 148*   Recent Labs    09/03/23 0314  NA 137  K 3.9  CL 97*  CO2 29  BUN 27*  CREATININE 1.02  GLUCOSE 177*  CALCIUM 8.4*   No results for input(s): "LABPT", "INR" in the last 72 hours.  Exam: General - Patient is Alert and Oriented Extremity - Neurologically intact Neurovascular intact Sensation intact distally Dorsiflexion/Plantar flexion intact Dressing - dressing C/D/I Motor Function - intact, moving foot and toes well on exam.   Past Medical History:  Diagnosis Date   Arthritis    COPD (chronic obstructive pulmonary disease) (HCC)    Dyspnea    with exertion   Family history of adverse reaction to anesthesia    son trouble getting put under   GERD (gastroesophageal reflux disease)    Hypercholesteremia    Hypertension    Pre-diabetes     Assessment/Plan: 1 Day Post-Op Procedure(s) (LRB): TOTAL KNEE ARTHROPLASTY  (Right) Principal Problem:   OA (osteoarthritis) of knee Active Problems:   Primary osteoarthritis of knee  Estimated body mass index is 27.91 kg/m as calculated from the following:   Height as of this encounter: 5\' 9"  (1.753 m).   Weight as of this encounter: 85.7 kg. Advance diet Up with therapy D/C IV fluids   Patient's anticipated LOS is less than 2 midnights, meeting these requirements: - Younger than 46 - Lives within 1 hour of care - Has a competent adult at home to recover with post-op recover - NO history of  - Chronic pain requiring opiods  - Diabetes  - Coronary Artery Disease  - Heart failure  - Heart attack  - Stroke  - DVT/VTE  - Cardiac arrhythmia  - Respiratory Failure/COPD  - Renal failure  - Anemia  - Advanced Liver disease     DVT Prophylaxis - Aspirin Weight bearing as tolerated. Continue therapy.    Plan is to go Home after hospital stay. Patient wants to go home today, but it will depend on how he does with therapy. Low threshold for keeping additional night due to medical comorbidities.   Scheduled for OPPT at [facility]). Follow-up in the office in 2 weeks.  The PDMP database was reviewed today prior to any opioid medications being prescribed to this patient.  Arther Abbott, PA-C Orthopedic Surgery [phone] 09/03/2023, 8:30 AM

## 2023-09-03 NOTE — TOC Transition Note (Signed)
Transition of Care [facility]) - CM/SW Discharge Note   Patient Details  Name: Ryan Weeks. MRN: [id number] Date of Birth: [birth year]  [facility]) CM/SW Contact:  Amada Jupiter, LCSW Phone Number: 09/03/2023, 12:09 PM   Clinical Narrative:     Met with pt who confirms he has needed DME in the home.  OPPT already arranged with [facility] in [place].  No further TOC needs.  Final next level of care: OP Rehab Barriers to Discharge: No Barriers Identified   Patient Goals and CMS Choice      Discharge Placement                         Discharge Plan and Services Additional resources added to the After Visit Summary for                  DME Arranged: N/A DME Agency: NA                  Social Determinants of Health (SDOH) Interventions SDOH Screenings   Food Insecurity: No Food Insecurity (09/02/2023)  Housing: Low Risk  (09/02/2023)  Transportation Needs: No Transportation Needs (09/02/2023)  Utilities: Not At Risk (09/02/2023)  Financial Resource Strain: Low Risk  (10/22/2022)   Received from [facility]  Tobacco Use: High Risk (09/02/2023)     Readmission Risk Interventions     No data to display

## 2023-09-03 NOTE — Progress Notes (Signed)
Physical Therapy Treatment Patient Details Name: Ryan Weeks. MRN: [id number] DOB: 22-Jan-1951 Today's Date: 09/03/2023   History of Present Illness 72 yo male presents to therapy s/p R TKA on 09/02/2023 due to failure of conservative measures. Pt PMH includes but is not limited to: colitis, HTN, tobacco abuse, diplopia, HLD, s/p AVR, PAF, COPD, GERD, and lumbar disc surgery.    PT Comments  Patient with noted  poor pleth readings on  monitor, . Patient tested on RA, with good readings on dynamap at 83%, after ambulation, ~85 on RA at rest and 92% on 1 L. Patient  reports that he does not desire supplemental O2. PA and RN aware of eesaturtion. Will check sats ambulating on 2 L next visit.   If plan is discharge home, recommend the following: A little help with walking and/or transfers;A little help with bathing/dressing/bathroom;Assistance with cooking/housework;Assist for transportation;Help with stairs or ramp for entrance   Can travel by private vehicle        Equipment Recommendations  None recommended by PT    Recommendations for Other Services       Precautions / Restrictions Precautions Precautions: Fall;Knee Precaution Comments: monitor sats Restrictions Weight Bearing Restrictions: No     Mobility  Bed Mobility               General bed mobility comments: in reclienr    Transfers Overall transfer level: Needs assistance Equipment used: Rolling walker (2 wheels) Transfers: Sit to/from Stand, Bed to chair/wheelchair/BSC Sit to Stand: Contact guard assist           General transfer comment: cues for safety and right LE position    Ambulation/Gait Ambulation/Gait assistance: Contact guard assist Gait Distance (Feet):  (withput O2 x 2) x 30' Assistive device: Rolling walker (2 wheels) Gait Pattern/deviations: Step-through pattern       General Gait Details: cues for sequence   Stairs Stairs: Yes Stairs assistance: Contact guard assist Stair  Management: One rail Right, Step to pattern, Forwards   General stair comments: practices 2 and 3  with 1 rail and  cane but then disreguarded cane once stepping up onto step, hel both rails even with  instruction to use cane   Wheelchair Mobility     Tilt Bed    Modified Rankin (Stroke Patients Only)       Balance Overall balance assessment: Mild deficits observed, not formally tested                                          Cognition Arousal: Alert Behavior During Therapy: WFL for tasks assessed/performed Overall Cognitive Status: Within Functional Limits for tasks assessed                                 General Comments: adamant to go home and not on supplemental oxygen        Exercises Total Joint Exercises Quad Sets: AROM, Both, 10 reps Straight Leg Raises: AROM, Right Long Arc Quad: AROM, Right, 10 reps Goniometric ROM: 10-80 right knee flex    General Comments        Pertinent Vitals/Pain Pain Assessment Pain Score: 6  Pain Location: R knee Pain Descriptors / Indicators: Aching, Constant, Discomfort, Operative site guarding Pain Intervention(s): Patient requesting pain meds-RN notified, Ice applied    Home Living  Prior Function            PT Goals (current goals can now be found in the care plan section) Progress towards PT goals: Progressing toward goals    Frequency    7X/week      PT Plan      Co-evaluation              AM-PAC PT "6 Clicks" Mobility   Outcome Measure  Help needed turning from your back to your side while in a flat bed without using bedrails?: A Little Help needed moving from lying on your back to sitting on the side of a flat bed without using bedrails?: A Little Help needed moving to and from a bed to a chair (including a wheelchair)?: A Little Help needed standing up from a chair using your arms (e.g., wheelchair or bedside chair)?: A  Little Help needed to walk in hospital room?: A Little Help needed climbing 3-5 steps with a railing? : A Little 6 Click Score: 18    End of Session Equipment Utilized During Treatment: Gait belt Activity Tolerance: Patient tolerated treatment well Patient left: in chair;with call bell/phone within reach Nurse Communication: Mobility status (desaturation) PT Visit Diagnosis: Unsteadiness on feet (R26.81);Other abnormalities of gait and mobility (R26.89);Muscle weakness (generalized) (M62.81);Pain;Difficulty in walking, not elsewhere classified (R26.2) Pain - Right/Left: Right Pain - part of body: Knee;Leg     Time: 1610-9604 PT Time Calculation (min) (ACUTE ONLY): 48 min  Charges:    $Gait Training: 23-37 mins $Therapeutic Exercise: 8-22 mins PT General Charges $$ ACUTE PT VISIT: 1 Visit                     Blanchard Kelch PT Acute Rehabilitation Services Office [phone] Weekend pager-[phone]    Rada Hay 09/03/2023, 2:29 PM

## 2023-09-03 NOTE — Plan of Care (Signed)
Patient progressing slowly with respiratory goals. Weaned to 0.5 lpm oxygen. Patient desaturates with minimal activity. He is encouraged throughout shift to cough, deep breathe, and utilize incentive spirometer. He states that he has sinus drainage and does not want to go home on oxygen. PA for Dr. Lequita Halt was notified of this and discharge canceled for today. Staff continue to monitor and encourage pulmonary toileting and mobility.  Problem: Education: Goal: Knowledge of General Education information will improve Description: Including pain rating scale, medication(s)/side effects and non-pharmacologic comfort measures Outcome: Progressing   Problem: Clinical Measurements: Goal: Ability to maintain clinical measurements within normal limits will improve Outcome: Progressing   Problem: Coping: Goal: Level of anxiety will decrease Outcome: Progressing   Problem: Safety: Goal: Ability to remain free from injury will improve Outcome: Progressing   Problem: Pain Management: Goal: General experience of comfort will improve Outcome: Progressing   Haydee Salter, RN 09/03/23 6:08 PM

## 2023-09-04 ENCOUNTER — Encounter (HOSPITAL_COMMUNITY): Payer: Self-pay | Admitting: Orthopedic Surgery

## 2023-09-04 ENCOUNTER — Inpatient Hospital Stay (HOSPITAL_COMMUNITY): Payer: Medicare HMO

## 2023-09-04 DIAGNOSIS — G8929 Other chronic pain: Secondary | ICD-10-CM | POA: Diagnosis present

## 2023-09-04 DIAGNOSIS — Z79899 Other long term (current) drug therapy: Secondary | ICD-10-CM | POA: Diagnosis not present

## 2023-09-04 DIAGNOSIS — Z7901 Long term (current) use of anticoagulants: Secondary | ICD-10-CM | POA: Diagnosis not present

## 2023-09-04 DIAGNOSIS — D696 Thrombocytopenia, unspecified: Secondary | ICD-10-CM | POA: Diagnosis present

## 2023-09-04 DIAGNOSIS — J9811 Atelectasis: Secondary | ICD-10-CM | POA: Diagnosis not present

## 2023-09-04 DIAGNOSIS — J441 Chronic obstructive pulmonary disease with (acute) exacerbation: Secondary | ICD-10-CM | POA: Diagnosis present

## 2023-09-04 DIAGNOSIS — E7800 Pure hypercholesterolemia, unspecified: Secondary | ICD-10-CM | POA: Diagnosis present

## 2023-09-04 DIAGNOSIS — M179 Osteoarthritis of knee, unspecified: Secondary | ICD-10-CM | POA: Diagnosis not present

## 2023-09-04 DIAGNOSIS — K219 Gastro-esophageal reflux disease without esophagitis: Secondary | ICD-10-CM | POA: Diagnosis present

## 2023-09-04 DIAGNOSIS — I3139 Other pericardial effusion (noninflammatory): Secondary | ICD-10-CM | POA: Diagnosis present

## 2023-09-04 DIAGNOSIS — Z7982 Long term (current) use of aspirin: Secondary | ICD-10-CM | POA: Diagnosis not present

## 2023-09-04 DIAGNOSIS — R7303 Prediabetes: Secondary | ICD-10-CM | POA: Diagnosis present

## 2023-09-04 DIAGNOSIS — R140 Abdominal distension (gaseous): Secondary | ICD-10-CM | POA: Diagnosis present

## 2023-09-04 DIAGNOSIS — D539 Nutritional anemia, unspecified: Secondary | ICD-10-CM | POA: Diagnosis present

## 2023-09-04 DIAGNOSIS — Z952 Presence of prosthetic heart valve: Secondary | ICD-10-CM | POA: Diagnosis not present

## 2023-09-04 DIAGNOSIS — I480 Paroxysmal atrial fibrillation: Secondary | ICD-10-CM | POA: Diagnosis present

## 2023-09-04 DIAGNOSIS — M170 Bilateral primary osteoarthritis of knee: Secondary | ICD-10-CM | POA: Diagnosis not present

## 2023-09-04 DIAGNOSIS — I10 Essential (primary) hypertension: Secondary | ICD-10-CM | POA: Diagnosis present

## 2023-09-04 DIAGNOSIS — J918 Pleural effusion in other conditions classified elsewhere: Secondary | ICD-10-CM | POA: Diagnosis present

## 2023-09-04 DIAGNOSIS — D72829 Elevated white blood cell count, unspecified: Secondary | ICD-10-CM | POA: Diagnosis present

## 2023-09-04 DIAGNOSIS — E1151 Type 2 diabetes mellitus with diabetic peripheral angiopathy without gangrene: Secondary | ICD-10-CM | POA: Diagnosis present

## 2023-09-04 DIAGNOSIS — J9601 Acute respiratory failure with hypoxia: Secondary | ICD-10-CM | POA: Diagnosis present

## 2023-09-04 DIAGNOSIS — Z716 Tobacco abuse counseling: Secondary | ICD-10-CM | POA: Diagnosis not present

## 2023-09-04 DIAGNOSIS — D6959 Other secondary thrombocytopenia: Secondary | ICD-10-CM | POA: Diagnosis present

## 2023-09-04 DIAGNOSIS — M1711 Unilateral primary osteoarthritis, right knee: Secondary | ICD-10-CM | POA: Diagnosis present

## 2023-09-04 DIAGNOSIS — M1710 Unilateral primary osteoarthritis, unspecified knee: Secondary | ICD-10-CM | POA: Diagnosis present

## 2023-09-04 DIAGNOSIS — K7031 Alcoholic cirrhosis of liver with ascites: Secondary | ICD-10-CM | POA: Diagnosis present

## 2023-09-04 DIAGNOSIS — F17210 Nicotine dependence, cigarettes, uncomplicated: Secondary | ICD-10-CM | POA: Diagnosis present

## 2023-09-04 LAB — CBC
HCT: 35.1 % — ABNORMAL LOW (ref 39.0–52.0)
Hemoglobin: 11 g/dL — ABNORMAL LOW (ref 13.0–17.0)
MCH: 33.5 pg (ref 26.0–34.0)
MCHC: 31.3 g/dL (ref 30.0–36.0)
MCV: 107 fL — ABNORMAL HIGH (ref 80.0–100.0)
Platelets: 153 10*3/uL (ref 150–400)
RBC: 3.28 MIL/uL — ABNORMAL LOW (ref 4.22–5.81)
RDW: 16.6 % — ABNORMAL HIGH (ref 11.5–15.5)
WBC: 13.2 10*3/uL — ABNORMAL HIGH (ref 4.0–10.5)
nRBC: 0 % (ref 0.0–0.2)

## 2023-09-04 MED ORDER — IPRATROPIUM-ALBUTEROL 0.5-2.5 (3) MG/3ML IN SOLN
3.0000 mL | Freq: Two times a day (BID) | RESPIRATORY_TRACT | Status: DC
  Administered 2023-09-04 – 2023-09-06 (×4): 3 mL via RESPIRATORY_TRACT
  Filled 2023-09-04 (×4): qty 3

## 2023-09-04 MED ORDER — IOHEXOL 300 MG/ML  SOLN
100.0000 mL | Freq: Once | INTRAMUSCULAR | Status: AC | PRN
  Administered 2023-09-04: 100 mL via INTRAVENOUS

## 2023-09-04 MED ORDER — BUDESONIDE 0.25 MG/2ML IN SUSP
0.2500 mg | Freq: Two times a day (BID) | RESPIRATORY_TRACT | Status: DC
  Administered 2023-09-04 – 2023-09-06 (×4): 0.25 mg via RESPIRATORY_TRACT
  Filled 2023-09-04 (×4): qty 2

## 2023-09-04 MED ORDER — ALUM & MAG HYDROXIDE-SIMETH 200-200-20 MG/5ML PO SUSP
15.0000 mL | ORAL | Status: DC | PRN
  Administered 2023-09-04 – 2023-09-05 (×3): 15 mL via ORAL
  Filled 2023-09-04 (×3): qty 30

## 2023-09-04 NOTE — Plan of Care (Signed)
  Problem: Education: Goal: Knowledge of General Education information will improve Description: Including pain rating scale, medication(s)/side effects and non-pharmacologic comfort measures Outcome: Progressing   Problem: Health Behavior/Discharge Planning: Goal: Ability to manage health-related needs will improve Outcome: Progressing   Problem: Clinical Measurements: Goal: Ability to maintain clinical measurements within normal limits will improve Outcome: Progressing Goal: Will remain free from infection Outcome: Progressing Goal: Diagnostic test results will improve Outcome: Progressing Goal: Respiratory complications will improve Outcome: Progressing Goal: Cardiovascular complication will be avoided Outcome: Progressing   Problem: Activity: Goal: Risk for activity intolerance will decrease Outcome: Progressing   Problem: Nutrition: Goal: Adequate nutrition will be maintained Outcome: Progressing   Problem: Coping: Goal: Level of anxiety will decrease Outcome: Progressing   Problem: Elimination: Goal: Will not experience complications related to urinary retention Outcome: Progressing   Problem: Pain Management: Goal: General experience of comfort will improve Outcome: Progressing   Problem: Safety: Goal: Ability to remain free from injury will improve Outcome: Progressing   Problem: Skin Integrity: Goal: Risk for impaired skin integrity will decrease Outcome: Progressing   Problem: Education: Goal: Knowledge of the prescribed therapeutic regimen will improve Outcome: Progressing Goal: Individualized Educational Video(s) Outcome: Progressing   Problem: Activity: Goal: Ability to avoid complications of mobility impairment will improve Outcome: Progressing Goal: Range of joint motion will improve Outcome: Progressing   Problem: Clinical Measurements: Goal: Postoperative complications will be avoided or minimized Outcome: Progressing   Problem: Pain  Management: Goal: Pain level will decrease with appropriate interventions Outcome: Progressing   Problem: Skin Integrity: Goal: Will show signs of wound healing Outcome: Progressing   Problem: Elimination: Goal: Will not experience complications related to bowel motility Outcome: Not Progressing

## 2023-09-04 NOTE — Progress Notes (Signed)
   Subjective: 2 Days Post-Op Procedure(s) (LRB): TOTAL KNEE ARTHROPLASTY (Right) Patient seen in rounds by Dr. Lequita Halt. Patient reports pain as moderate.   Patient continues to note post nasal drip and difficulty breathing. Requiring supplemental O2. Concerned about going home in current state.  Objective: Vital signs in last 24 hours: Temp:  [97.4 F (36.3 C)-98.5 F (36.9 C)] 97.4 F (36.3 C) (12/11 0537) Pulse Rate:  [64-72] 64 (12/11 0548) Resp:  [16-18] 18 (12/11 0548) BP: (120-127)/(47-76) 127/74 (12/11 0537) SpO2:  [87 %-95 %] 87 % (12/11 0548)  Intake/Output from previous day:  Intake/Output Summary (Last 24 hours) at 09/04/2023 0756 Last data filed at 09/04/2023 0550 Gross per 24 hour  Intake 1273.77 ml  Output 970 ml  Net 303.77 ml    Intake/Output this shift: No intake/output data recorded.  Labs: Recent Labs    09/03/23 0314 09/04/23 0313  HGB 10.9* 11.0*   Recent Labs    09/03/23 0314 09/04/23 0313  WBC 7.3 13.2*  RBC 3.37* 3.28*  HCT 35.4* 35.1*  PLT 148* 153   Recent Labs    09/03/23 0314  NA 137  K 3.9  CL 97*  CO2 29  BUN 27*  CREATININE 1.02  GLUCOSE 177*  CALCIUM 8.4*   No results for input(s): "LABPT", "INR" in the last 72 hours.  Exam: General - Patient is Alert and Oriented Extremity - Neurologically intact Neurovascular intact Sensation intact distally Dorsiflexion/Plantar flexion intact Dressing/Incision - clean, dry, no drainage Motor Function - intact, moving foot and toes well on exam.  Past Medical History:  Diagnosis Date   Arthritis    COPD (chronic obstructive pulmonary disease) (HCC)    Dyspnea    with exertion   Family history of adverse reaction to anesthesia    son trouble getting put under   GERD (gastroesophageal reflux disease)    Hypercholesteremia    Hypertension    Pre-diabetes     Assessment/Plan: 2 Days Post-Op Procedure(s) (LRB): TOTAL KNEE ARTHROPLASTY (Right) Principal Problem:   OA  (osteoarthritis) of knee Active Problems:   Primary osteoarthritis of knee  Estimated body mass index is 27.91 kg/m as calculated from the following:   Height as of this encounter: 5\' 9"  (1.753 m).   Weight as of this encounter: 85.7 kg.  DVT Prophylaxis - Aspirin Weight-bearing as tolerated.  Will consult hospitalist for input on management of low O2 saturation. Patient reportedly with sinus infection prior to admission and hx of COPD. Does not require supplemental oxygen at baseline.  Continue physical therapy while admitted.  Alfonzo Feller, PA-C Orthopedic Surgery 09/04/2023, 7:56 AM

## 2023-09-04 NOTE — Consult Note (Signed)
Initial Consultation Note   Patient: Ryan Weeks. MWN:[id number] DOB: [birth year] PCP: Kandyce Rud, MD DOA: 09/02/2023 DOS: the patient was seen and examined on 09/04/2023 Primary service: Ollen Gross, MD  Referring physician: Ollen Gross MD. Reason for consult: COPD and hypoxia. Assessment and Plan: Principal Problem:   Primary osteoarthritis of knee   OA (osteoarthritis) of knee Status post TKA. Continue postop care per primary team.   Active Problems:   Acute respiratory failure with hypoxia (HCC)  In the setting of:   COPD exacerbation (HCC)   Atelectasis  Observation/telemetry Continue supplemental oxygen. Will avoid systemic glucocorticoid for now. Trial of budesonide twice daily. As scheduled DuoNeb twice daily. Continue as needed albuterol nebs/MDI.    Tobacco abuse Smoking cessation advised. Declined nicotine replacement therapy.    Abdominal distention Significant. Will obtain abdomen/pelvis CAT scan.    Leukocytosis  Postoperatively. Monitor WBC.    Hyperlipidemia Continue pravastatin 80 mg p.o. daily.    Hypertension Continue metoprolol succinate 25 mg p.o. daily. Alternating bumetanide 2 and 3 mg every other day. Hold losartan due to soft blood pressures. May need to hold diuretic in a.m. if still hypotensive.    PAD (peripheral artery disease) (HCC) Continue aspirin, DOAC and statin.    Paroxysmal atrial fibrillation (HCC) Resume apixaban per orthopedic surgery. Continue metoprolol succinate as above.    Macrocytic anemia Check B12 and folic acid level.    Prediabetes Monitor fasting glucose. Follow-up with PCP as an outpatient.    Thrombocytopenia (HCC) Monitor platelet count.   TRH will continue to follow the patient.  HPI: Ryan Weeks. is a 72 y.o. male with past medical history of osteoarthritis, COPD, dyspnea on exertion, GERD, hyperlipidemia, prediabetes, hypertension, bicuspid aortic valve, status post AVR,  history of tobacco abuse, paroxysmal atrial fibrillation currently not on anticoagulation, peripheral arterial disease who underwent a right TKA on 09/02/2023 with Dr.  Lequita Halt who we are seeing due to hypoxia with new oxygen requirement of 1 to 1.5 LPM.  The patient has also been having abdominal distention for over a month.  He had abdominal distention as well during the summer associated with edema. He denied fever, chills, rhinorrhea, sore throat or hemoptysis.  He has occasional wheezing no chest pain, palpitations, diaphoresis, PND, orthopnea or pitting edema of the lower extremities.  No abdominal pain, nausea, emesis, diarrhea, melena or hematochezia.  No flank pain, dysuria, frequency or hematuria.  No polyuria, polydipsia, polyphagia or blurred vision.   Review of Systems: As mentioned in the history of present illness. All other systems reviewed and are negative. Past Medical History:  Diagnosis Date   Arthritis    COPD (chronic obstructive pulmonary disease) (HCC)    Dyspnea    with exertion   Family history of adverse reaction to anesthesia    son trouble getting put under   GERD (gastroesophageal reflux disease)    Hypercholesteremia    Hypertension    Pre-diabetes    Past Surgical History:  Procedure Laterality Date   CARDIAC VALVE REPLACEMENT  2019   CATARACT EXTRACTION Bilateral    COLONOSCOPY WITH PROPOFOL N/A 05/09/2021   Procedure: COLONOSCOPY WITH PROPOFOL;  Surgeon: [name], MD;  Location: [facility] ENDOSCOPY;  Service: Endoscopy;  Laterality: N/A;   LUMBAR DISC SURGERY     Social History:  reports that he has been smoking cigarettes. He has never used smokeless tobacco. He reports current alcohol use of about 2.0 standard drinks of alcohol per week. He reports that he  does not use drugs.  No Known Allergies  History reviewed. No pertinent family history.  Prior to Admission medications   Medication Sig Start Date End Date Taking? Authorizing Provider   acetaminophen (TYLENOL) 500 MG tablet Take 1,000 mg by mouth every 6 (six) hours as needed for moderate pain (pain score 4-6).   Yes [provider]  albuterol (VENTOLIN HFA) 108 (90 Base) MCG/ACT inhaler Inhale 2 puffs into the lungs every 4 (four) hours as needed for wheezing or shortness of breath. 07/15/23  Yes [provider]  bumetanide (BUMEX) 2 MG tablet Take 2-3 mg by mouth See admin instructions. Alternate taking 2 mg one day and 3 mg the next 05/30/23 05/29/24 Yes [provider]  citalopram (CELEXA) 20 MG tablet Take 20 mg by mouth every morning. 04/27/21  Yes [provider]  guaiFENesin (MUCINEX) 600 MG 12 hr tablet Take 600 mg by mouth 2 (two) times daily as needed (congestion).   Yes [provider]  losartan (COZAAR) 100 MG tablet Take 100 mg by mouth in the morning. 12/10/19  Yes [provider]  Magnesium 500 MG TABS Take 500 mg by mouth at bedtime.   Yes [provider]  metoprolol succinate (TOPROL-XL) 25 MG 24 hr tablet Take 25 mg by mouth every morning. 10/28/19  Yes [provider]  pravastatin (PRAVACHOL) 80 MG tablet Take 1 tablet (80 mg total) by mouth at bedtime. 12/31/19  Yes Rhetta Mura, MD  aspirin 81 MG chewable tablet Chew 1 tablet (81 mg total) by mouth 2 (two) times daily for 20 days. Then take one 81 mg aspirin once a day for three weeks. Then discontinue aspirin. 09/03/23 09/23/23  Edmisten, Lyn Hollingshead, PA  methocarbamol (ROBAXIN) 500 MG tablet Take 1 tablet (500 mg total) by mouth every 6 (six) hours as needed for muscle spasms. 09/03/23   Edmisten, Kristie L, PA  ondansetron (ZOFRAN) 4 MG tablet Take 1 tablet (4 mg total) by mouth every 6 (six) hours as needed for nausea. 09/03/23   Edmisten, Kristie L, PA  oxyCODONE (OXY IR/ROXICODONE) 5 MG immediate release tablet Take 1-2 tablets (5-10 mg total) by mouth every 6 (six) hours as needed for severe pain (pain score 7-10) or moderate pain (pain  score 4-6). 09/03/23   Derenda Fennel, PA    Physical Exam: Vitals:   09/03/23 2202 09/04/23 0135 09/04/23 0537 09/04/23 0548  BP: (!) 121/57  127/74   Pulse: 69 70 66 64  Resp: 17 18 16 18   Temp: 98.4 F (36.9 C)  (!) 97.4 F (36.3 C)   TempSrc: Oral  Oral   SpO2: 93% 95% 94% (!) 87%  Weight:      Height:       Physical Exam Vitals and nursing note reviewed.  Constitutional:      General: He is awake. He is not in acute distress.    Appearance: Normal appearance. He is overweight.     Interventions: Nasal cannula in place.  HENT:     Head: Normocephalic.     Nose: No rhinorrhea.     Mouth/Throat:     Mouth: Mucous membranes are moist.  Eyes:     General: No scleral icterus.    Pupils: Pupils are equal, round, and reactive to light.  Neck:     Vascular: No JVD.  Cardiovascular:     Rate and Rhythm: Normal rate and regular rhythm.     Heart sounds: S1 normal and S2 normal.  Pulmonary:     Effort: Pulmonary effort is normal.     Breath sounds: Wheezing present.  Abdominal:     General: Abdomen is protuberant. Bowel sounds are normal. There is distension.     Palpations: Abdomen is soft.     Tenderness: There is no abdominal tenderness. There is no right CVA tenderness, left CVA tenderness or guarding.  Musculoskeletal:     Cervical back: Neck supple.     Right lower leg: No edema.     Left lower leg: No edema.     Comments: Postop dressing in place.  Skin:    General: Skin is warm and dry.  Neurological:     General: No focal deficit present.     Mental Status: He is alert and oriented to person, place, and time.  Psychiatric:        Mood and Affect: Mood normal.        Behavior: Behavior is cooperative.     Data Reviewed:   06/03/2023 [facility] echocardiogram complete:  Results are pending, will review when available.  ECHOCARDIOGRAPHIC MEASUREMENTS  2D DIMENSIONS  AORTA                  Values   Normal Range   MAIN PA         Values    Normal Range                 Annulus: 1.7 cm       [2.3-2.9]         PA Main: nm*       [1.5-2.1]              Aorta Sin: 2.1 cm       [3.1-3.7]    RIGHT VENTRICLE            ST Junction: nm*          [2.6-3.2]         RV Base: nm*       [<4.2]             Asc.Aorta: 2.5 cm       [2.6-3.4]          RV Mid: 3.5 cm    [< 3.5]  LEFT VENTRICLE                                      RV Length: nm*       [<8.6]                 LVIDd: 4.3 cm       [4.2-5.9]    INFERIOR VENA CAVA                  LVIDs: 3.3 cm                        Max. IVC: nm*       [<=2.1]                    FS: 22.1 %       [>25]            Min. IVC: nm*                    SWT: 1.1 cm       [0.6-1.0]    ------------------  PWT: 1.1 cm       [0.6-1.0]    nm* - not measured  LEFT ATRIUM                LA Diam: 4.8 cm       [3.0-4.0]            LA A4C Area: nm*          [<20]             LA Volume: nm*          [18-58]  _________________________________________________________________________________________  ECHOCARDIOGRAPHIC DESCRIPTIONS  AORTIC ROOT                   Size: Normal             Dissection: INDETERM FOR DISSECTION  AORTIC VALVE               Leaflets: BIOPROSTHETIC               Morphology: MILDLY THICKENED               Mobility: Fully mobile  LEFT VENTRICLE                   Size: Normal                        Anterior: Normal            Contraction: REGIONALLY IMPAIRED            Lateral: Normal             Closest EF: 45% (Estimated)                 Septal: HYPOCONTRACTILE              LV Masses: No Masses                       Apical: Normal                    LVH: None                          Inferior: HYPOCONTRACTILE                                                      Posterior: Normal           Dias.FxClass: (Grade 3) reversible restrictive pattern  MITRAL VALVE               Leaflets: Normal                        Mobility: Fully mobile             Morphology: Normal  LEFT ATRIUM                    Size: MODERATELY ENLARGED          LA Masses: No masses              IA Septum: Normal IAS  MAIN PA                   Size: Normal  PULMONIC VALVE             Morphology: Normal                        Mobility: Fully mobile  RIGHT VENTRICLE              RV Masses: No Masses                         Size: Normal              Free Wall: HYPOCONTRACTILE            Contraction: MOD GLOBAL DECREASE  TRICUSPID VALVE               Leaflets: Normal                        Mobility: Fully mobile             Morphology: Normal  RIGHT ATRIUM                   Size: Normal                        RA Other: None                RA Mass: No masses  PERICARDIUM                 Fluid: MILD EFFUSION              Pleural eff: MODERATE PLEURAL EFFUSION  INFERIOR VENACAVA                   Size: DILATED ABNORMAL RESPIRATORY COLLAPSE  _________________________________________________________________________________________   DOPPLER ECHO and OTHER SPECIAL PROCEDURES                 Aortic: MILD AR                    BIOPROSTHETIC AoV                         405.0 cm/sec peak vel      65.6 mmHg peak grad                         36.0 mmHg mean grad                 Mitral: MILD MR                    No MS                         180.0 cm/sec peak vel      13.0 mmHg peak grad                         2.0 mmHg mean grad                         MV Inflow E Vel = 172.0 cm/sec      MV Annulus E'Vel = 7.7 cm/sec                         E/E'Ratio = 22.3  Tricuspid: MILD TR                    No TS                         296.0 cm/sec peak TR vel   40.0 mmHg peak RV pressure              Pulmonary: MILD PR                    No PS                         88.4 cm/sec peak vel       3.1 mmHg peak grad  _________________________________________________________________________________________  INTERPRETATION  MILD LV SYSTOLIC DYSFUNCTION (See above)  MODERATE RV SYSTOLIC DYSFUNCTION (See above)  MILD  VALVULAR REGURGITATION (See above)  LA MODERATELY ENLARGED  MODERATE PLEURAL EFFUSION  MILD PERICARDIAL EFFUSION  RA MILDLY ENLARGED  MILD AR, MR, TR, PR  s/p bioprosthetic AVR  EF 45%   Family Communication:  Primary team communication:  Thank you very much for involving [place] in the care of your patient.  Author: Bobette Mo, MD 09/04/2023 8:22 AM  For on call review [email or web address].   This document was prepared using Dragon voice recognition software and may contain some unintended transcription errors.

## 2023-09-04 NOTE — Progress Notes (Signed)
Physical Therapy Treatment Patient Details Name: Ryan Weeks. MRN: [id number] DOB: [birth year] Today's Date: 09/04/2023   History of Present Illness 72 yo male presents to therapy s/p R TKA on 09/02/2023 due to failure of conservative measures. Pt PMH includes but is not limited to: colitis, HTN, tobacco abuse, diplopia, HLD, s/p AVR, PAF, COPD, GERD, and lumbar disc surgery.    PT Comments  The patient reports SOB. Spo2  on 1.5 L resting 93%. Once up and ambulating to recliner, SPO2 not  accurately reading on  Pulse oximeter. Placed on 1 LNC, dynamap, reading 95-100% at rest.   Continue PT for mobility .    If plan is discharge home, recommend the following: A little help with walking and/or transfers;A little help with bathing/dressing/bathroom;Assistance with cooking/housework;Assist for transportation;Help with stairs or ramp for entrance   Can travel by private vehicle      yes  Equipment Recommendations    no   Recommendations for Other Services       Precautions / Restrictions Precautions Precautions: Fall;Knee Precaution Comments: monitor sats Restrictions RLE Weight Bearing: Weight bearing as tolerated     Mobility  Bed Mobility   Bed Mobility: Supine to Sit     Supine to sit: Supervision          Transfers Overall transfer level: Needs assistance Equipment used: Rolling walker (2 wheels)   Sit to Stand: Contact guard assist           General transfer comment: cues for safety and right LE position    Ambulation/Gait Ambulation/Gait assistance: Contact guard assist Gait Distance (Feet): 20 Feet Assistive device: Rolling walker (2 wheels) Gait Pattern/deviations: Step-to pattern, Step-through pattern Gait velocity: decr     General Gait Details: cues for sequence   Stairs             Wheelchair Mobility     Tilt Bed    Modified Rankin (Stroke Patients Only)       Balance Overall balance assessment: Mild deficits observed,  not formally tested                                          Cognition Arousal: Alert Behavior During Therapy: Restless, WFL for tasks assessed/performed                                            Exercises Total Joint Exercises Ankle Circles/Pumps: PROM Quad Sets: AROM, Both, 10 reps    General Comments        Pertinent Vitals/Pain Pain Assessment Pain Location: R knee Pain Descriptors / Indicators: Aching, Discomfort, Operative site guarding Pain Intervention(s): Monitored during session, Patient requesting pain meds-RN notified, Limited activity within patient's tolerance    Home Living                          Prior Function            PT Goals (current goals can now be found in the care plan section) Progress towards PT goals: Progressing toward goals    Frequency    7X/week      PT Plan      Co-evaluation  AM-PAC PT "6 Clicks" Mobility   Outcome Measure  Help needed turning from your back to your side while in a flat bed without using bedrails?: None Help needed moving from lying on your back to sitting on the side of a flat bed without using bedrails?: None Help needed moving to and from a bed to a chair (including a wheelchair)?: A Little Help needed standing up from a chair using your arms (e.g., wheelchair or bedside chair)?: A Little Help needed to walk in hospital room?: A Little Help needed climbing 3-5 steps with a railing? : A Little 6 Click Score: 20    End of Session Equipment Utilized During Treatment: Oxygen Activity Tolerance: Treatment limited secondary to medical complications (Comment) (SOB) Patient left: in chair;with call bell/phone within reach;with family/visitor present Nurse Communication: Mobility status PT Visit Diagnosis: Unsteadiness on feet (R26.81);Other abnormalities of gait and mobility (R26.89);Muscle weakness (generalized) (M62.81);Pain;Difficulty in  walking, not elsewhere classified (R26.2) Pain - Right/Left: Right Pain - part of body: Knee;Leg     Time: 1610-9604 PT Time Calculation (min) (ACUTE ONLY): 38 min  Charges:    $Gait Training: 8-22 mins $Therapeutic Exercise: 8-22 mins $Self Care/Home Management: 8-22 PT General Charges $$ ACUTE PT VISIT: 1 Visit                     Blanchard Kelch PT Acute Rehabilitation Services Office [phone] Weekend [phone]    Rada Hay 09/04/2023, 1:47 PM

## 2023-09-05 ENCOUNTER — Inpatient Hospital Stay (HOSPITAL_COMMUNITY): Payer: Medicare HMO

## 2023-09-05 DIAGNOSIS — R7303 Prediabetes: Secondary | ICD-10-CM

## 2023-09-05 DIAGNOSIS — I5043 Acute on chronic combined systolic (congestive) and diastolic (congestive) heart failure: Secondary | ICD-10-CM | POA: Insufficient documentation

## 2023-09-05 DIAGNOSIS — J90 Pleural effusion, not elsewhere classified: Secondary | ICD-10-CM | POA: Insufficient documentation

## 2023-09-05 DIAGNOSIS — I10 Essential (primary) hypertension: Secondary | ICD-10-CM

## 2023-09-05 DIAGNOSIS — M170 Bilateral primary osteoarthritis of knee: Secondary | ICD-10-CM

## 2023-09-05 DIAGNOSIS — D696 Thrombocytopenia, unspecified: Secondary | ICD-10-CM

## 2023-09-05 DIAGNOSIS — Z720 Tobacco use: Secondary | ICD-10-CM

## 2023-09-05 DIAGNOSIS — J9601 Acute respiratory failure with hypoxia: Secondary | ICD-10-CM

## 2023-09-05 DIAGNOSIS — I480 Paroxysmal atrial fibrillation: Secondary | ICD-10-CM

## 2023-09-05 DIAGNOSIS — I739 Peripheral vascular disease, unspecified: Secondary | ICD-10-CM

## 2023-09-05 DIAGNOSIS — M179 Osteoarthritis of knee, unspecified: Secondary | ICD-10-CM | POA: Diagnosis not present

## 2023-09-05 DIAGNOSIS — K7460 Unspecified cirrhosis of liver: Secondary | ICD-10-CM | POA: Insufficient documentation

## 2023-09-05 HISTORY — PX: IR THORACENTESIS ASP PLEURAL SPACE W/IMG GUIDE: IMG5380

## 2023-09-05 LAB — COMPREHENSIVE METABOLIC PANEL
ALT: 12 U/L (ref 0–44)
AST: 20 U/L (ref 15–41)
Albumin: 3.4 g/dL — ABNORMAL LOW (ref 3.5–5.0)
Alkaline Phosphatase: 104 U/L (ref 38–126)
Anion gap: 9 (ref 5–15)
BUN: 38 mg/dL — ABNORMAL HIGH (ref 8–23)
CO2: 30 mmol/L (ref 22–32)
Calcium: 8.8 mg/dL — ABNORMAL LOW (ref 8.9–10.3)
Chloride: 95 mmol/L — ABNORMAL LOW (ref 98–111)
Creatinine, Ser: 0.87 mg/dL (ref 0.61–1.24)
GFR, Estimated: >60 mL/min (ref >60)
Glucose, Bld: 82 mg/dL (ref 70–99)
Potassium: 3.9 mmol/L (ref 3.5–5.1)
Sodium: 134 mmol/L — ABNORMAL LOW (ref 135–145)
Total Bilirubin: 1.1 mg/dL (ref <1.2)
Total Protein: 7 g/dL (ref 6.5–8.1)

## 2023-09-05 LAB — CBC
HCT: 33.1 % — ABNORMAL LOW (ref 39.0–52.0)
Hemoglobin: 10.7 g/dL — ABNORMAL LOW (ref 13.0–17.0)
MCH: 33.9 pg (ref 26.0–34.0)
MCHC: 32.3 g/dL (ref 30.0–36.0)
MCV: 104.7 fL — ABNORMAL HIGH (ref 80.0–100.0)
Platelets: 154 10*3/uL (ref 150–400)
RBC: 3.16 MIL/uL — ABNORMAL LOW (ref 4.22–5.81)
RDW: 16.9 % — ABNORMAL HIGH (ref 11.5–15.5)
WBC: 10 10*3/uL (ref 4.0–10.5)
nRBC: 0 % (ref 0.0–0.2)

## 2023-09-05 LAB — BODY FLUID CELL COUNT WITH DIFFERENTIAL
Lymphs, Fluid: 56 %
Monocyte-Macrophage-Serous Fluid: 17 % — ABNORMAL LOW (ref 50–90)
Neutrophil Count, Fluid: 27 % — ABNORMAL HIGH (ref 0–25)
Total Nucleated Cell Count, Fluid: 108 uL (ref 0–1000)

## 2023-09-05 LAB — LACTATE DEHYDROGENASE, PLEURAL OR PERITONEAL FLUID: LD, Fluid: 45 U/L — ABNORMAL HIGH (ref 3–23)

## 2023-09-05 LAB — FOLATE: Folate: 5.6 ng/mL — ABNORMAL LOW (ref >5.9)

## 2023-09-05 LAB — PROTEIN, PLEURAL OR PERITONEAL FLUID: Total protein, fluid: <3 g/dL

## 2023-09-05 LAB — VITAMIN B12: Vitamin B-12: 492 pg/mL (ref 180–914)

## 2023-09-05 LAB — BRAIN NATRIURETIC PEPTIDE: B Natriuretic Peptide: 3840.7 pg/mL — ABNORMAL HIGH (ref 0.0–100.0)

## 2023-09-05 LAB — PROTEIN, TOTAL: Total Protein: 6.9 g/dL (ref 6.5–8.1)

## 2023-09-05 LAB — PROCALCITONIN: Procalcitonin: <0.1 ng/mL

## 2023-09-05 LAB — LACTATE DEHYDROGENASE: LDH: 236 U/L — ABNORMAL HIGH (ref 98–192)

## 2023-09-05 MED ORDER — LIDOCAINE HCL 1 % IJ SOLN
20.0000 mL | Freq: Once | INTRAMUSCULAR | Status: DC
  Filled 2023-09-05: qty 20

## 2023-09-05 MED ORDER — LIDOCAINE HCL 1 % IJ SOLN
INTRAMUSCULAR | Status: AC
  Filled 2023-09-05: qty 20

## 2023-09-05 NOTE — Progress Notes (Signed)
PT Cancellation Note  Patient Details Name: Ryan Weeks. MRN: [id number] DOB: [birth year]   Cancelled Treatment:    Reason Eval/Treat Not Completed: Patient at procedure or test/unavailable Will see patient in PM. Blanchard Kelch PT Acute Rehabilitation Services Office [phone] Weekend [phone]   Rada Hay 09/05/2023, 11:28 AM

## 2023-09-05 NOTE — Progress Notes (Signed)
Physical Therapy Treatment Patient Details Name: Ryan Weeks. MRN: [id number] DOB: 1950/10/14 Today's Date: 09/05/2023   History of Present Illness 72 yo male presents to therapy s/p R TKA on 09/02/2023 due to failure of conservative measures. Pt PMH includes but is not limited to: colitis, HTN, tobacco abuse, diplopia, HLD, s/p AVR, PAF, COPD, GERD, and lumbar disc surgery. Noted SOB, hypoxia, right pleaural effusion, S/P thoracentesis 12/12.    PT Comments  Patient declined ambulation, reports having been up. Patient performed  Right TKA exercises, ice placed on knee. Continue PT and monitor for  supplemental Oxygen requirements prior to DC.    If plan is discharge home, recommend the following: A little help with walking and/or transfers;A little help with bathing/dressing/bathroom;Assistance with cooking/housework;Assist for transportation;Help with stairs or ramp for entrance   Can travel by private vehicle        Equipment Recommendations  None recommended by PT (using Youth RW from mother)    Recommendations for Other Services       Precautions / Restrictions Precautions Precautions: Fall;Knee Precaution Comments: monitor sats Restrictions RLE Weight Bearing Per Provider Order: Weight bearing as tolerated     Mobility  Bed Mobility               General bed mobility comments: deferred    Transfers                   General transfer comment: declined    Ambulation/Gait                   Stairs             Wheelchair Mobility     Tilt Bed    Modified Rankin (Stroke Patients Only)       Balance                                            Cognition Arousal: Alert Behavior During Therapy: Restless, Anxious Overall Cognitive Status: Within Functional Limits for tasks assessed                                          Exercises Total Joint Exercises Ankle Circles/Pumps: AROM, Both, 10  reps Quad Sets: AROM, Both, 10 reps Heel Slides: AAROM, Right, 10 reps Hip ABduction/ADduction: AAROM, Right, 10 reps Long Arc Quad: AAROM, Right, 10 reps Goniometric ROM: 10-60 knee flex-right    General Comments        Pertinent Vitals/Pain Pain Assessment Pain Score: 5  Pain Location: R knee Pain Descriptors / Indicators: Aching, Discomfort, Operative site guarding Pain Intervention(s): Monitored during session, Patient requesting pain meds-RN notified, Ice applied    Home Living                          Prior Function            PT Goals (current goals can now be found in the care plan section) Progress towards PT goals: Progressing toward goals    Frequency    7X/week      PT Plan      Co-evaluation              AM-PAC PT "6 Clicks" Mobility  Outcome Measure  Help needed turning from your back to your side while in a flat bed without using bedrails?: None Help needed moving from lying on your back to sitting on the side of a flat bed without using bedrails?: None Help needed moving to and from a bed to a chair (including a wheelchair)?: A Little Help needed standing up from a chair using your arms (e.g., wheelchair or bedside chair)?: A Little Help needed to walk in hospital room?: A Little Help needed climbing 3-5 steps with a railing? : A Little 6 Click Score: 20    End of Session Equipment Utilized During Treatment: Oxygen Activity Tolerance: Patient limited by fatigue Patient left: in bed;with call bell/phone within reach;with family/visitor present;with bed alarm set Nurse Communication: Mobility status PT Visit Diagnosis: Unsteadiness on feet (R26.81);Other abnormalities of gait and mobility (R26.89);Muscle weakness (generalized) (M62.81);Pain;Difficulty in walking, not elsewhere classified (R26.2) Pain - Right/Left: Right Pain - part of body: Knee;Leg     Time: 1540-1555 PT Time Calculation (min) (ACUTE ONLY): 15  min  Charges:    $Therapeutic Exercise: 8-22 mins PT General Charges $$ ACUTE PT VISIT: 1 Visit                     Blanchard Kelch PT Acute Rehabilitation Services Office [phone] Weekend [phone]    Rada Hay 09/05/2023, 4:17 PM

## 2023-09-05 NOTE — Procedures (Signed)
PROCEDURE SUMMARY:  Successful image-guided right thoracentesis. Yielded 1.0 liters of clear yellow fluid. Patient tolerated procedure well. EBL < 1 mL No immediate complications.  Specimen was sent for labs. Post procedure CXR shows no pneumothorax.  Please see imaging section of Epic for full dictation.  Villa Herb PA-C 09/05/2023 11:58 AM

## 2023-09-05 NOTE — Plan of Care (Signed)
  Problem: Coping: Goal: Level of anxiety will decrease Outcome: Progressing   Problem: Elimination: Goal: Will not experience complications related to urinary retention Outcome: Progressing   Problem: Pain Management: Goal: General experience of comfort will improve Outcome: Progressing   Problem: Skin Integrity: Goal: Risk for impaired skin integrity will decrease Outcome: Progressing

## 2023-09-05 NOTE — Progress Notes (Signed)
Attempted to wean patient off of oxygen. Patient sats in the low 90's when resting in bed on 1 Liter of Oxygen. O2 sats drop to the mid 80's with activity. Patient coughs and shows increase labor of breathing with any movement.

## 2023-09-05 NOTE — Progress Notes (Signed)
PROGRESS NOTE  Ryan Weeks. ZOX:[id number] DOB: 02/28/1951   PCP: Kandyce Rud, MD  Patient is from: Home.  DOA: 09/02/2023 LOS: 1  Chief complaints No chief complaint on file.    Brief Narrative / Interim history: 72 year old M with PMH of HFmrEF, COPD, DOE, bicuspid AV s/ AVR, PAF not on AC, PAD, HTN, prediabetes, HLD, GERD and osteoarthritis s/p right TKA by Dr. Lequita Halt on 12/9.  Hospitalist service consulted due to new oxygen requirement and abdominal distention.  Patient's recent chest x-ray concerning for CHF exacerbation.  Patient's recent chest x-ray concerning for CHF and pleural effusion, right > left.  CT abdomen and pelvis showing liver cirrhosis with moderate ascites and anasarca, partially visualized bilateral pleural effusion and areas of airspace opacity in right lung suggesting atelectasis or pneumonia.  Patient's Bumex was reduced during this admission.  He has also received some IV fluid perioperatively.  Patient was started on breathing treatments and encouraged to work on incentive spirometry.  He is Bumex was also increased to home dose.  The next day, he had right thoracocentesis with removal of 1 L clear yellow fluid.  Subjective: Seen and examined this afternoon.  He states his breathing status fluctuates.  He has no recollection of liver cirrhosis or heart failure.  He is eager to go home but agreeable to having paracentesis tomorrow  Objective: Vitals:   09/04/23 2215 09/04/23 2228 09/05/23 0533 09/05/23 0813  BP:   (!) 100/42 130/68  Pulse: 66  64 65  Resp:   20   Temp:   97.7 F (36.5 C)   TempSrc:   Oral   SpO2: (!) 87% 94% 96%   Weight:      Height:        Examination:  GENERAL: Appears frail with significant temporal wasting. HEENT: MMM.  Vision and hearing grossly intact.  NECK: Supple.  No apparent JVD.  RESP:  No IWOB.  Diminished aeration bilaterally. CVS:  RRR. Heart sounds normal.  ABD/GI/GU: BS+. Abd full and distended.   Nontender. MSK/EXT:  Moves extremities. S/p right TKA.  Has TED hose bilaterally SKIN: no apparent skin lesion or wound NEURO: Awake, alert and oriented appropriately.  No apparent focal neuro deficit. PSYCH: Calm. Normal affect.   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 Cleotis Nipper on 12/9 -Per primary team   Acute on chronic combined CHF: TTE in 05/2023 with LVEF of 45%, G3 DD, moderate LAE, moderate pleural effusion and bioprosthetic AVR.initial chest x-ray was concerning for CHF.  BNP elevated to 3840.  His procalcitonin is negative.  CT abdomen and pelvis concerning for liver cirrhosis with ascites/anasarca.   -Bumex increased to home dose.  May consider IV diuretics if urine output is not sufficient. -S/p right thoracocentesis with removal of 1 L clear yellow fluid.  Follow-up fluid analysis. -Strict intake and output, daily weight, renal functions and electrolytes  Bilateral pleural effusion, Rt>Lt: Likely due to CHF -S/p right thoracocentesis with removal of 1 L clear yellow fluid. -Follow-up fluid analysis -Diuretics as above  Chronic COPD: Doubt exacerbation. 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  Seems new diagnosis.  Never here to of liver disease before.  He is abdomen is distended.  No clinical suspicion for SBP. -IR paracentesis.  Patient  is agreeable -Continue home Bumex -Needs outpatient follow-up with GI   Hypertension: Normotensive. -Continue metoprolol succinate 25 mg p.o. daily. -Alternating bumetanide 2 and 3 mg every other day.   PAD:  -Continue aspirin, DOAC and statin.   PAF: Rate controlled.  Not on anticoagulation despite elevated CHA2DS2-VASc  score.. -Continue metoprolol -Defer to patient's outpatient cardiologist    Macrocytic anemia/thrombocytopenia: Likely due to liver cirrhosis.  Folic acid and B12 within normal. -Monitor.   Prediabetes: A1c 5.8% in 2021 glucose within normal  Tobacco use disorder: Smokes about 4 to 5 cigarettes a day. -Encouraged smoking cessation -Not interested in nicotine patch    Leukocytosis: Likely demargination.  Resolved without antibiotics.  Pro-Cal negative.   Hyperlipidemia -Continue pravastatin 80 mg p.o. daily.   Body mass index is 27.91 kg/m.           DVT prophylaxis:  SCDs Start: 09/02/23 1650 Place TED hose Start: 09/02/23 1650  Code Status: Full code Family Communication: None at bedside Level of care: Med-Surg Status is: Inpatient    Final disposition: Per primary Consultants:  [facility] Interventional radiology  55 minutes with more than 50% spent in reviewing records, counseling patient/family and coordinating care.   Sch Meds:  Scheduled Meds:  aspirin  81 mg Oral BID   budesonide (PULMICORT) nebulizer solution  0.25 mg Nebulization BID   bumetanide  2 mg Oral Q48H   bumetanide  3 mg Oral Q48H   docusate sodium  100 mg Oral BID   ipratropium-albuterol  3 mL Nebulization BID   lidocaine  20 mL Intradermal Once   metoprolol succinate  25 mg Oral q morning   pravastatin  80 mg Oral QHS   Continuous Infusions: PRN Meds:.acetaminophen, albuterol, alum & mag hydroxide-simeth, bisacodyl, diphenhydrAMINE, HYDROmorphone (DILAUDID) injection, menthol-cetylpyridinium **OR** phenol, methocarbamol **OR** methocarbamol (ROBAXIN) injection, metoCLOPramide **OR** metoCLOPramide (REGLAN) injection, ondansetron **OR** ondansetron (ZOFRAN) IV, oxyCODONE, oxyCODONE, polyethylene glycol, sodium phosphate  Antimicrobials: Anti-infectives (From admission, onward)    Start     Dose/Rate Route Frequency Ordered Stop   09/02/23 1900  ceFAZolin (ANCEF) IVPB 2g/100 mL premix         2 g 200 mL/hr over 30 Minutes Intravenous Every 6 hours 09/02/23 1649 09/03/23 0701   09/02/23 1030  ceFAZolin (ANCEF) IVPB 2g/100 mL premix        2 g 200 mL/hr over 30 Minutes Intravenous On call to O.R. 09/02/23 1023 09/02/23 1331        I have personally reviewed the following labs and images: CBC: Recent Labs  Lab 09/03/23 0314 09/04/23 0313 09/05/23 0401  WBC 7.3 13.2* 10.0  HGB 10.9* 11.0* 10.7*  HCT 35.4* 35.1* 33.1*  MCV 105.0* 107.0* 104.7*  PLT 148* 153 154   BMP &GFR Recent Labs  Lab 09/03/23 0314 09/05/23 0401  NA 137 134*  K 3.9 3.9  CL 97* 95*  CO2 29 30  GLUCOSE 177* 82  BUN 27* 38*  CREATININE 1.02 0.87  CALCIUM 8.4* 8.8*   Estimated Creatinine Clearance: 85.7 mL/min (by C-G formula based on SCr of 0.87 mg/dL). Liver & Pancreas: Recent Labs  Lab 09/05/23 0401  AST 20  ALT 12  ALKPHOS 104  BILITOT 1.1  PROT 7.0  ALBUMIN 3.4*   No results for input(s): "LIPASE", "AMYLASE" in the last 168 hours. No results for input(s): "AMMONIA" in the last 168 hours. Diabetic: No results for input(s): "HGBA1C" in the last 72 hours. No results for input(s): "GLUCAP" in the last 168 hours.  Cardiac Enzymes: No results for input(s): "CKTOTAL", "CKMB", "CKMBINDEX", "TROPONINI" in the last 168 hours. No results for input(s): "PROBNP" in the last 8760 hours. Coagulation Profile: No results for input(s): "INR", "PROTIME" in the last 168 hours. Thyroid Function Tests: No results for input(s): "TSH", "T4TOTAL", "FREET4", "T3FREE", "THYROIDAB" in the last 72 hours. Lipid Profile: No results for input(s): "CHOL", "HDL", "LDLCALC", "TRIG", "CHOLHDL", "LDLDIRECT" in the last 72 hours. Anemia Panel: Recent Labs    09/05/23 0401  VITAMINB12 492  FOLATE 5.6*   Urine analysis:    Component Value Date/Time   COLORURINE STRAW (A) 12/30/2019 2111   APPEARANCEUR CLEAR 12/30/2019 2111   LABSPEC 1.015 12/30/2019 2111   PHURINE 6.0 12/30/2019 2111   GLUCOSEU  NEGATIVE 12/30/2019 2111   HGBUR NEGATIVE 12/30/2019 2111   BILIRUBINUR NEGATIVE 12/30/2019 2111   KETONESUR NEGATIVE 12/30/2019 2111   PROTEINUR NEGATIVE 12/30/2019 2111   NITRITE NEGATIVE 12/30/2019 2111   LEUKOCYTESUR NEGATIVE 12/30/2019 2111   Sepsis Labs: Invalid input(s): "PROCALCITONIN", "LACTICIDVEN"  Microbiology: Recent Results (from the past 240 hours)  Surgical pcr screen     Status: None   Collection Time: 08/29/23 10:26 AM   Specimen: Nasal Mucosa; Nasal Swab  Result Value Ref Range Status   MRSA, PCR NEGATIVE NEGATIVE Final   Staphylococcus aureus NEGATIVE NEGATIVE Final    Comment: (NOTE) The Xpert SA Assay (FDA approved for NASAL specimens in patients 47 years of age and older), is one component of a comprehensive surveillance program. It is not intended to diagnose infection nor to guide or monitor treatment. Performed at [facility], [address], [place], [place] [address]     Radiology Studies: CT ABDOMEN PELVIS W CONTRAST Result Date: 09/04/2023 CLINICAL DATA:  Abdominal pain.  Concern for bowel obstruction. EXAM: CT ABDOMEN AND PELVIS WITH CONTRAST TECHNIQUE: Multidetector CT imaging of the abdomen and pelvis was performed using the standard protocol following bolus administration of intravenous contrast. RADIATION DOSE REDUCTION: This exam was performed according to the departmental dose-optimization program which includes automated exposure control, adjustment of the mA and/or kV according to patient size and/or use of iterative reconstruction technique. CONTRAST:  OMNIPAQUE IOHEXOL 300 MG/ML  SOLN COMPARISON:  Chest radiograph dated 09/03/2023. FINDINGS: Lower chest: Partially visualized bilateral pleural effusions, right greater than left. Areas of airspace opacity in the right lung may represent atelectasis or pneumonia. Aortic valve repair. There is coronary vascular calcification. Moderate ascites.  No free air. Hepatobiliary:  Cirrhosis. No biliary ductal dilatation. The gallbladder is unremarkable Pancreas: The pancreas is mildly atrophic otherwise unremarkable. Spleen: Normal in size without focal abnormality. Adrenals/Urinary Tract: The adrenal glands are poorly visualized. There is no hydronephrosis on either side. The urinary bladder is grossly unremarkable. Stomach/Bowel: There is sigmoid diverticulosis. There is no bowel obstruction. The appendix is normal. Vascular/Lymphatic: Advanced aortoiliac atherosclerotic disease. The IVC is unremarkable. No portal venous gas. No obvious adenopathy. Reproductive: The prostate and seminal vesicles are grossly unremarkable. Other: Diffuse subcutaneous edema and anasarca. Musculoskeletal: Degenerative changes of the spine. No acute osseous pathology. IMPRESSION: 1. Cirrhosis with moderate ascites and anasarca. 2. Sigmoid diverticulosis. No bowel obstruction. Normal appendix. 3. Partially visualized bilateral pleural effusions, right greater than left. Areas of airspace opacity in the right lung may represent atelectasis or pneumonia. 4.  Aortic Atherosclerosis (ICD10-I70.0). Electronically Signed   By: Elgie Collard M.D.   On: 09/04/2023 22:39      Eurika Sandy T. Aaryanna Hyden [facility]  If 7PM-7AM, please contact night-coverage [email or web address] 09/05/2023,  12:30 PM

## 2023-09-05 NOTE — Progress Notes (Signed)
   Subjective: 3 Days Post-Op Procedure(s) (LRB): TOTAL KNEE ARTHROPLASTY (Right) Patient reports pain as mild.   Patient seen in rounds for Dr. Lequita Halt. Patient is on the side of the bed this AM, moving around very well with the knee. States he feels better than yesterday.   Objective: Vital signs in last 24 hours: Temp:  [97.6 F (36.4 C)-97.9 F (36.6 C)] 97.7 F (36.5 C) (12/12 0533) Pulse Rate:  [57-66] 65 (12/12 0813) Resp:  [18-20] 20 (12/12 0533) BP: (100-130)/(42-68) 130/68 (12/12 0813) SpO2:  [87 %-97 %] 96 % (12/12 0533)  Intake/Output from previous day:  Intake/Output Summary (Last 24 hours) at 09/05/2023 0857 Last data filed at 09/05/2023 0600 Gross per 24 hour  Intake 960 ml  Output 420 ml  Net 540 ml    Intake/Output this shift: No intake/output data recorded.  Labs: Recent Labs    09/03/23 0314 09/04/23 0313 09/05/23 0401  HGB 10.9* 11.0* 10.7*   Recent Labs    09/04/23 0313 09/05/23 0401  WBC 13.2* 10.0  RBC 3.28* 3.16*  HCT 35.1* 33.1*  PLT 153 154   Recent Labs    09/03/23 0314 09/05/23 0401  NA 137 134*  K 3.9 3.9  CL 97* 95*  CO2 29 30  BUN 27* 38*  CREATININE 1.02 0.87  GLUCOSE 177* 82  CALCIUM 8.4* 8.8*   No results for input(s): "LABPT", "INR" in the last 72 hours.  Exam: General - Patient is Alert and Oriented Extremity - Neurologically intact Neurovascular intact Sensation intact distally Dorsiflexion/Plantar flexion intact Dressing/Incision - There was blood underneath the aquacel, I removed this and applied a new dressing. There was no active drainage coming from the incision. Motor Function - intact, moving foot and toes well on exam.   Past Medical History:  Diagnosis Date   Arthritis    COPD (chronic obstructive pulmonary disease) (HCC)    Dyspnea    with exertion   Family history of adverse reaction to anesthesia    son trouble getting put under   GERD (gastroesophageal reflux disease)     Hypercholesteremia    Hypertension    Pre-diabetes     Assessment/Plan: 3 Days Post-Op Procedure(s) (LRB): TOTAL KNEE ARTHROPLASTY (Right) Principal Problem:   OA (osteoarthritis) of knee Active Problems:   Hyperlipidemia   Hypertension   PAD (peripheral artery disease) (HCC)   Paroxysmal atrial fibrillation (HCC)   Tobacco abuse   Primary osteoarthritis of knee   Macrocytic anemia   Prediabetes   Thrombocytopenia (HCC)   Abdominal distention   Acute respiratory failure with hypoxia (HCC)   COPD exacerbation (HCC)   Atelectasis   Leukocytosis  Estimated body mass index is 27.91 kg/m as calculated from the following:   Height as of this encounter: 5\' 9"  (1.753 m).   Weight as of this encounter: 85.7 kg. Up with therapy  DVT Prophylaxis - Aspirin Weight-bearing as tolerated  Undergoing hospitalist workup, very appreciative of their input. Discharge will be pending medical clearance, the knee looks great today.   Arther Abbott, PA-C Orthopedic Surgery [phone] 09/05/2023, 8:57 AM

## 2023-09-06 ENCOUNTER — Inpatient Hospital Stay (HOSPITAL_COMMUNITY): Payer: Medicare HMO

## 2023-09-06 DIAGNOSIS — M170 Bilateral primary osteoarthritis of knee: Secondary | ICD-10-CM | POA: Diagnosis not present

## 2023-09-06 DIAGNOSIS — M179 Osteoarthritis of knee, unspecified: Secondary | ICD-10-CM | POA: Diagnosis not present

## 2023-09-06 DIAGNOSIS — J9601 Acute respiratory failure with hypoxia: Secondary | ICD-10-CM | POA: Diagnosis not present

## 2023-09-06 DIAGNOSIS — I480 Paroxysmal atrial fibrillation: Secondary | ICD-10-CM | POA: Diagnosis not present

## 2023-09-06 HISTORY — PX: IR PARACENTESIS: IMG2679

## 2023-09-06 LAB — RENAL FUNCTION PANEL
Albumin: 3.1 g/dL — ABNORMAL LOW (ref 3.5–5.0)
Anion gap: 10 (ref 5–15)
BUN: 45 mg/dL — ABNORMAL HIGH (ref 8–23)
CO2: 28 mmol/L (ref 22–32)
Calcium: 8.6 mg/dL — ABNORMAL LOW (ref 8.9–10.3)
Chloride: 93 mmol/L — ABNORMAL LOW (ref 98–111)
Creatinine, Ser: 0.89 mg/dL (ref 0.61–1.24)
GFR, Estimated: >60 mL/min (ref >60)
Glucose, Bld: 72 mg/dL (ref 70–99)
Phosphorus: 3.8 mg/dL (ref 2.5–4.6)
Potassium: 3.6 mmol/L (ref 3.5–5.1)
Sodium: 131 mmol/L — ABNORMAL LOW (ref 135–145)

## 2023-09-06 LAB — BODY FLUID CELL COUNT WITH DIFFERENTIAL
Eos, Fluid: 0 %
Lymphs, Fluid: 18 %
Monocyte-Macrophage-Serous Fluid: 33 % — ABNORMAL LOW (ref 50–90)
Neutrophil Count, Fluid: 31 % — ABNORMAL HIGH (ref 0–25)
Other Cells, Fluid: 18 %
Total Nucleated Cell Count, Fluid: 188 uL (ref 0–1000)

## 2023-09-06 LAB — CBC
HCT: 33.4 % — ABNORMAL LOW (ref 39.0–52.0)
Hemoglobin: 10.8 g/dL — ABNORMAL LOW (ref 13.0–17.0)
MCH: 33.2 pg (ref 26.0–34.0)
MCHC: 32.3 g/dL (ref 30.0–36.0)
MCV: 102.8 fL — ABNORMAL HIGH (ref 80.0–100.0)
Platelets: 170 10*3/uL (ref 150–400)
RBC: 3.25 MIL/uL — ABNORMAL LOW (ref 4.22–5.81)
RDW: 16.4 % — ABNORMAL HIGH (ref 11.5–15.5)
WBC: 8 10*3/uL (ref 4.0–10.5)
nRBC: 0 % (ref 0.0–0.2)

## 2023-09-06 LAB — MAGNESIUM: Magnesium: 2.3 mg/dL (ref 1.7–2.4)

## 2023-09-06 MED ORDER — LIDOCAINE HCL 1 % IJ SOLN
20.0000 mL | Freq: Once | INTRAMUSCULAR | Status: DC
  Filled 2023-09-06: qty 20

## 2023-09-06 MED ORDER — LIDOCAINE HCL 1 % IJ SOLN
INTRAMUSCULAR | Status: AC
Start: 2023-09-06 — End: ?
  Filled 2023-09-06: qty 20

## 2023-09-06 NOTE — Progress Notes (Signed)
   Subjective: 4 Days Post-Op Procedure(s) (LRB): TOTAL KNEE ARTHROPLASTY (Right) Patient seen in rounds by Dr. Lequita Halt. Patient notably frustrated this morning stating he did not come to the hospital for the whole work-up he is currently receiving. Reports being in the hospital is making him sick and his swelling was not that bad prior to admission.  Objective: Vital signs in last 24 hours: Temp:  [97.7 F (36.5 C)-98.2 F (36.8 C)] 98.2 F (36.8 C) (12/12 2053) Pulse Rate:  [60-72] 72 (12/13 0409) Resp:  [12-20] 20 (12/13 0409) BP: (119-130)/(42-68) 123/42 (12/13 0409) SpO2:  [81 %-100 %] 94 % (12/13 0409) Weight:  [90.4 kg] 90.4 kg (12/12 1343)  Intake/Output from previous day:  Intake/Output Summary (Last 24 hours) at 09/06/2023 0754 Last data filed at 09/06/2023 0535 Gross per 24 hour  Intake 660 ml  Output 275 ml  Net 385 ml    Intake/Output this shift: No intake/output data recorded.  Labs: Recent Labs    09/04/23 0313 09/05/23 0401  HGB 11.0* 10.7*   Recent Labs    09/04/23 0313 09/05/23 0401  WBC 13.2* 10.0  RBC 3.28* 3.16*  HCT 35.1* 33.1*  PLT 153 154   Recent Labs    09/05/23 0401  NA 134*  K 3.9  CL 95*  CO2 30  BUN 38*  CREATININE 0.87  GLUCOSE 82  CALCIUM 8.8*   No results for input(s): "LABPT", "INR" in the last 72 hours.  Exam: General - Patient is Alert and Oriented Extremity - Neurologically intact Neurovascular intact Sensation intact distally Dorsiflexion/Plantar flexion intact Dressing/Incision - scant drainage noted on aquacel Motor Function - intact, moving foot and toes well on exam.  Past Medical History:  Diagnosis Date   Arthritis    COPD (chronic obstructive pulmonary disease) (HCC)    Dyspnea    with exertion   Family history of adverse reaction to anesthesia    son trouble getting put under   GERD (gastroesophageal reflux disease)    Hypercholesteremia    Hypertension    Pre-diabetes      Assessment/Plan: 4 Days Post-Op Procedure(s) (LRB): TOTAL KNEE ARTHROPLASTY (Right) Principal Problem:   OA (osteoarthritis) of knee Active Problems:   Hyperlipidemia   Hypertension   PAD (peripheral artery disease) (HCC)   Paroxysmal atrial fibrillation (HCC)   Tobacco abuse   Primary osteoarthritis of knee   Macrocytic anemia   Prediabetes   Thrombocytopenia (HCC)   Abdominal distention   Acute respiratory failure with hypoxia (HCC)   COPD exacerbation (HCC)   Atelectasis   Leukocytosis   Acute on chronic combined systolic and diastolic CHF (congestive heart failure) (HCC)   Cirrhosis of liver with ascites (HCC)   Bilateral pleural effusion  Estimated body mass index is 29.43 kg/m as calculated from the following:   Height as of this encounter: 5\' 9"  (1.753 m).   Weight as of this encounter: 90.4 kg.  DVT Prophylaxis - Aspirin Weight-bearing as tolerated.  Continue physical therapy while in hospital.  Medical work-up per hospitalist. Scheduled for paracentesis today. Appreciative of their assistance in management of patient.   Patient adamant that he is leaving today against medical advice. Discussed rational for staying and continuing to get the care he needs.  Alfonzo Feller, PA-C Orthopedic Surgery 09/06/2023, 7:54 AM

## 2023-09-06 NOTE — Progress Notes (Signed)
Physical Therapy Treatment Patient Details Name: Ryan Weeks. MRN: [id number] DOB: April 01, 1951 Today's Date: 09/06/2023   History of Present Illness 72 yo male presents to therapy s/p R TKA on 09/02/2023 due to failure of conservative measures. Pt PMH includes but is not limited to: colitis, HTN, tobacco abuse, diplopia, HLD, s/p AVR, PAF, COPD, GERD, and lumbar disc surgery. Noted SOB, hypoxia, right pleaural effusion, S/P thoracentesis 12/12.    PT Comments b POD # 4 second session Pt in bathroom > 30 min trying to have a BM.   Assisted out of bathroom to practice stairs.  General transfer comment: Pt self able from toilet as well as perform self peri care after BM. General Gait Details: pt was able to amb out of bathroom to recliner approx 10 feet but required a seated rest break x 8 min before he was able to amb to portable stairs there were located just outside his room.  RA drops to high 70's with activity and RR increase to mid 30's. Required another extended seated rest break after stairs.  Again, pt refuses to wear any oxygen. General stair comments: Pt able to correctly recall proper sequencing.  Tolerated well except 4/4 dyspnea. Pt given handout of HEP.  Stated he was familiar as his Spouse had a Partial Knee Replacement.   Addressed all mobility questions, discussed appropriate activity, educated on use of ICE.    Pt has met his mobility goals and is ready for D/C to home when cleared medically.  Pt has a Paracentesis scheduled later today.     If plan is discharge home, recommend the following: A little help with walking and/or transfers;A little help with bathing/dressing/bathroom;Assistance with cooking/housework;Assist for transportation;Help with stairs or ramp for entrance   Can travel by private vehicle        Equipment Recommendations  None recommended by PT    Recommendations for Other Services       Precautions / Restrictions Precautions Precautions:  Fall;Knee Precaution Comments: monitor sats Restrictions Weight Bearing Restrictions Per Provider Order: No RLE Weight Bearing Per Provider Order: Weight bearing as tolerated     Mobility  Bed Mobility       General bed mobility comments: OOB in bathroom    Transfers Overall transfer level: Needs assistance Equipment used: Rolling walker (2 wheels) Transfers: Sit to/from Stand Sit to Stand: Supervision           General transfer comment: Pt self able from toilet as well as perform self peri care after BM.    Ambulation/Gait Ambulation/Gait assistance: Supervision Gait Distance (Feet): 28 Feet Assistive device: Rolling walker (2 wheels) Gait Pattern/deviations: Step-to pattern, Step-through pattern Gait velocity: decr     General Gait Details: pt was able to amb out of bathroom to recliner approx 10 feet but required a seated rest break x 8 min before he was able to amb to portable stairs there were located just outside his room.  RA drops to high 70's with activity and RR increase to mid 30's. Required another extended seated rest break after stairs.  Again, pt refuses to wear any oxygen.   Stairs Stairs: Yes Stairs assistance: Supervision Stair Management: One rail Right, Step to pattern, Forwards Number of Stairs: 2 General stair comments: Pt able to correctly recall proper sequencing.  Tolerated well except 4/4 dyspnea.   Wheelchair Mobility     Tilt Bed    Modified Rankin (Stroke Patients Only)       Balance  Cognition Arousal: Alert Behavior During Therapy: Restless, Anxious                                   General Comments: AxO x 3 frustrated, wants to go home TODAY.  Sharred that his wife passed in July "Heart Failure".  Currently lives home alone but has "good family support".  Admits to drinking Nautral Lite and wine plus Tobbacco use. Stated, he will not wear any  oxygen "once home".    Also stated, he was leaving "today by 4pm"        Exercises      General Comments        Pertinent Vitals/Pain Pain Assessment Pain Assessment: Faces Faces Pain Scale: Hurts even more Pain Location: R knee Pain Descriptors / Indicators: Aching, Discomfort, Operative site guarding Pain Intervention(s): Monitored during session, Premedicated before session, Repositioned, Ice applied    Home Living                          Prior Function            PT Goals (current goals can now be found in the care plan section) Progress towards PT goals: Progressing toward goals    Frequency    7X/week      PT Plan      Co-evaluation              AM-PAC PT "6 Clicks" Mobility   Outcome Measure  Help needed turning from your back to your side while in a flat bed without using bedrails?: None Help needed moving from lying on your back to sitting on the side of a flat bed without using bedrails?: None Help needed moving to and from a bed to a chair (including a wheelchair)?: None Help needed standing up from a chair using your arms (e.g., wheelchair or bedside chair)?: None Help needed to walk in hospital room?: None Help needed climbing 3-5 steps with a railing? : A Little 6 Click Score: 23    End of Session Equipment Utilized During Treatment: Gait belt Activity Tolerance: Patient limited by fatigue Patient left: in chair;with call bell/phone within reach;with chair alarm set;with family/visitor present Nurse Communication: Mobility status PT Visit Diagnosis: Unsteadiness on feet (R26.81);Other abnormalities of gait and mobility (R26.89);Muscle weakness (generalized) (M62.81);Pain;Difficulty in walking, not elsewhere classified (R26.2) Pain - Right/Left: Right Pain - part of body: Knee;Leg     Time: 4098-1191 PT Time Calculation (min) (ACUTE ONLY): 26 min  Charges:    $Gait Training: 8-22 mins $Therapeutic Activity: 8-22  mins PT General Charges $$ ACUTE PT VISIT: 1 Visit                    Felecia Shelling  PTA Acute  Rehabilitation Services Office M-F          [phone]

## 2023-09-06 NOTE — Progress Notes (Signed)
PHYSICAL THERAPY  SATURATION QUALIFICATIONS: (This note is used to comply with regulatory documentation for home oxygen) used R ear lobe due to poor peripheral circulation distal fingers   Patient Saturations on Room Air at Rest = 87 - 90%  Patient Saturations on Room Air while Ambulating 12 feet = 78-84%  Patient Saturations on 2 Liters of oxygen while Ambulating =90%  Please briefly explain why patient needs home oxygen: Pt stated he "will not wear any oxygen once he is home".  "Don't bother getting my any cause I won't use it".  Felecia Shelling  PTA Acute  Rehabilitation Services Office M-F          [phone]

## 2023-09-06 NOTE — Progress Notes (Signed)
PROGRESS NOTE  Ryan Weeks. VHQ:[id number] DOB: 24-Sep-1951   PCP: Kandyce Rud, MD  Patient is from: Home.  DOA: 09/02/2023 LOS: 2  Chief complaints No chief complaint on file.    Brief Narrative / Interim history: 72 year old M with PMH of HFmrEF, COPD, DOE, bicuspid AV s/ AVR, PAF not on AC, PAD, HTN, prediabetes, HLD, GERD and osteoarthritis s/p right TKA by Dr. Lequita Halt on 12/9.  Hospitalist service consulted due to new oxygen requirement and abdominal distention.  Patient's recent chest x-ray concerning for CHF exacerbation.  Patient's recent chest x-ray concerning for CHF and pleural effusion, right > left.  CT abdomen and pelvis showing liver cirrhosis with moderate ascites and anasarca, partially visualized bilateral pleural effusion and areas of airspace opacity in right lung suggesting atelectasis or pneumonia.  Patient's Bumex was reduced during this admission.  He has also received some IV fluid perioperatively.  Patient was started on breathing treatments and encouraged to work on incentive spirometry.  He is Bumex was also increased to home dose.  The next day, he had right thoracocentesis with removal of 1 L culture-negative transudative fluid.  Continued on home Bumex.  Paracentesis ordered.  Subjective: Seen and examined earlier this morning.  No major events overnight of this morning.  Patient's niece at the bedside.  Patient is adamant about going home and not interested in staying in the hospital to have paracentesis and IV diuretics.  He believes his abdominal distention has improved and does not need IV diuretics.  Objective: Vitals:   09/06/23 0409 09/06/23 0904 09/06/23 0922 09/06/23 1333  BP: (!) 123/42   (!) 94/46  Pulse: 72   64  Resp: 20   16  Temp:      TempSrc:      SpO2: 94% 97%  96%  Weight:   92.1 kg   Height:        Examination:  GENERAL: Appears frail with significant temporal wasting. HEENT: MMM.  Vision and hearing grossly intact.   NECK: Supple.  No apparent JVD.  RESP:  No IWOB.  Diminished aeration bilaterally. CVS:  RRR. Heart sounds normal.  ABD/GI/GU: BS+. Abd full and distended.  Nontender. MSK/EXT:  Moves extremities. S/p right TKA.  Has TED hose bilaterally SKIN: Chronic ecchymosis/skin bruises on bilateral forearms. NEURO: Awake, alert and oriented appropriately.  No apparent focal neuro deficit. PSYCH: Calm. Normal affect.   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 Cleotis Nipper on 12/9 -Per primary team   Acute on chronic combined CHF: TTE in 05/2023 with LVEF of 45%, G3 DD, moderate LAE, moderate pleural effusion and bioprosthetic AVR.initial chest x-ray was concerning for CHF.  BNP elevated to 3840.  His procalcitonin is negative.  CT abdomen and pelvis concerning for liver cirrhosis with ascites/anasarca.  Home Bumex resumed.  I&O incomplete.  Soft blood pressure this morning. -S/p right thoracocentesis with removal of 1 L transudative culture negative fluid -Continue home Bumex.  Added holding parameters. -Strict intake and output, daily weight, renal functions and electrolytes.  Discussed with staff about strict intake and output   Alcoholic liver cirrhosis with ascites:  Seems new diagnosis.  Never told to have liver disease before.  His abdomen is distended.  CT abdomen and pelvis with liver cirrhosis and moderate ascites.  No clinical suspicion for SBP. -IR consulted for paracentesis but patient is adamant about going home -Continue home Bumex with  holding parameters. -Needs outpatient follow-up with GI  Bilateral pleural effusion, Rt>Lt: Likely due to CHF -S/p right thoracocentesis with removal of 1 L culture-negative transudative fluid -Diuretics as above  Chronic COPD: Doubt exacerbation. -Continue breathing treatments -Encourage smoking cessation  Atelectasis -Incentive  spirometry, OOB, PT/OT  Hypertension/hypotension: Hypotensive this morning. -Added holding parameters to Toprol-XL and diuretics.   PAD:  -Continue aspirin, DOAC and statin.   PAF: Rate controlled.  Not on anticoagulation despite elevated CHA2DS2-VASc score.. -Continue metoprolol -Defer to patient's outpatient cardiologist    Macrocytic anemia/thrombocytopenia: Likely due to liver cirrhosis.  Folic acid and B12 within normal. -Monitor.   Prediabetes: A1c 5.8% in 2021 glucose within normal  Tobacco use disorder: Smokes about 4 to 5 cigarettes a day. -Encouraged smoking cessation -Not interested in nicotine patch  Leukocytosis: Likely demargination. Pro-Cal negative. Resolved without antibiotics.    Hyperlipidemia -Continue pravastatin 80 mg p.o. daily.   Body mass index is 29.98 kg/m.           DVT prophylaxis:  SCDs Start: 09/02/23 1650 Place TED hose Start: 09/02/23 1650  Code Status: Full code Family Communication: Updated patient's niece at bedside. Level of care: Med-Surg Status is: Inpatient    Final disposition: Per primary Consultants:  [facility] Interventional radiology  55 minutes with more than 50% spent in reviewing records, counseling patient/family and coordinating care.   Sch Meds:  Scheduled Meds:  aspirin  81 mg Oral BID   budesonide (PULMICORT) nebulizer solution  0.25 mg Nebulization BID   bumetanide  2 mg Oral Q48H   bumetanide  3 mg Oral Q48H   docusate sodium  100 mg Oral BID   ipratropium-albuterol  3 mL Nebulization BID   lidocaine  20 mL Intradermal Once   metoprolol succinate  25 mg Oral q morning   pravastatin  80 mg Oral QHS   Continuous Infusions: PRN Meds:.acetaminophen, albuterol, alum & mag hydroxide-simeth, bisacodyl, diphenhydrAMINE, HYDROmorphone (DILAUDID) injection, menthol-cetylpyridinium **OR** phenol, methocarbamol **OR** methocarbamol (ROBAXIN) injection, metoCLOPramide **OR** metoCLOPramide (REGLAN) injection,  ondansetron **OR** ondansetron (ZOFRAN) IV, oxyCODONE, oxyCODONE, polyethylene glycol, sodium phosphate  Antimicrobials: Anti-infectives (From admission, onward)    Start     Dose/Rate Route Frequency Ordered Stop   09/02/23 1900  ceFAZolin (ANCEF) IVPB 2g/100 mL premix        2 g 200 mL/hr over 30 Minutes Intravenous Every 6 hours 09/02/23 1649 09/03/23 0701   09/02/23 1030  ceFAZolin (ANCEF) IVPB 2g/100 mL premix        2 g 200 mL/hr over 30 Minutes Intravenous On call to O.R. 09/02/23 1023 09/02/23 1331        I have personally reviewed the following labs and images: CBC: Recent Labs  Lab 09/03/23 0314 09/04/23 0313 09/05/23 0401 09/06/23 0914  WBC 7.3 13.2* 10.0 8.0  HGB 10.9* 11.0* 10.7* 10.8*  HCT 35.4* 35.1* 33.1* 33.4*  MCV 105.0* 107.0* 104.7* 102.8*  PLT 148* 153 154 170   BMP &GFR Recent Labs  Lab 09/03/23 0314 09/05/23 0401 09/06/23 0914  NA 137 134* 131*  K 3.9 3.9 3.6  CL 97* 95* 93*  CO2 29 30 28   GLUCOSE 177* 82 72  BUN 27* 38* 45*  CREATININE 1.02 0.87 0.89  CALCIUM 8.4* 8.8* 8.6*  MG  --   --  2.3  PHOS  --   --  3.8   Estimated Creatinine Clearance: 86.6 mL/min (by C-G formula based on SCr of 0.89 mg/dL). Liver & Pancreas: Recent Labs  Lab 09/05/23  0401 09/05/23 1207 09/06/23 0914  AST 20  --   --   ALT 12  --   --   ALKPHOS 104  --   --   BILITOT 1.1  --   --   PROT 7.0 6.9  --   ALBUMIN 3.4*  --  3.1*   No results for input(s): "LIPASE", "AMYLASE" in the last 168 hours. No results for input(s): "AMMONIA" in the last 168 hours. Diabetic: No results for input(s): "HGBA1C" in the last 72 hours. No results for input(s): "GLUCAP" in the last 168 hours. Cardiac Enzymes: No results for input(s): "CKTOTAL", "CKMB", "CKMBINDEX", "TROPONINI" in the last 168 hours. No results for input(s): "PROBNP" in the last 8760 hours. Coagulation Profile: No results for input(s): "INR", "PROTIME" in the last 168 hours. Thyroid Function Tests: No  results for input(s): "TSH", "T4TOTAL", "FREET4", "T3FREE", "THYROIDAB" in the last 72 hours. Lipid Profile: No results for input(s): "CHOL", "HDL", "LDLCALC", "TRIG", "CHOLHDL", "LDLDIRECT" in the last 72 hours. Anemia Panel: Recent Labs    09/05/23 0401  VITAMINB12 492  FOLATE 5.6*   Urine analysis:    Component Value Date/Time   COLORURINE STRAW (A) 12/30/2019 2111   APPEARANCEUR CLEAR 12/30/2019 2111   LABSPEC 1.015 12/30/2019 2111   PHURINE 6.0 12/30/2019 2111   GLUCOSEU NEGATIVE 12/30/2019 2111   HGBUR NEGATIVE 12/30/2019 2111   BILIRUBINUR NEGATIVE 12/30/2019 2111   KETONESUR NEGATIVE 12/30/2019 2111   PROTEINUR NEGATIVE 12/30/2019 2111   NITRITE NEGATIVE 12/30/2019 2111   LEUKOCYTESUR NEGATIVE 12/30/2019 2111   Sepsis Labs: Invalid input(s): "PROCALCITONIN", "LACTICIDVEN"  Microbiology: Recent Results (from the past 240 hours)  Surgical pcr screen     Status: None   Collection Time: 08/29/23 10:26 AM   Specimen: Nasal Mucosa; Nasal Swab  Result Value Ref Range Status   MRSA, PCR NEGATIVE NEGATIVE Final   Staphylococcus aureus NEGATIVE NEGATIVE Final    Comment: (NOTE) The Xpert SA Assay (FDA approved for NASAL specimens in patients 29 years of age and older), is one component of a comprehensive surveillance program. It is not intended to diagnose infection nor to guide or monitor treatment. Performed at [facility], [address], [place], [place] [address]   Body fluid culture w Gram Stain     Status: None (Preliminary result)   Collection Time: 09/05/23 11:21 AM   Specimen: Lung, Right Lower Lobe; Pleural Fluid  Result Value Ref Range Status   Specimen Description   Final    PLEURAL Performed at [facility], [address] [place], [place] [address]    Special Requests   Final    NONE Performed at [facility], [address] [place], [place] [address]    Gram Stain   Final    WBC  PRESENT,BOTH PMN AND MONONUCLEAR NO ORGANISMS SEEN CYTOSPIN SMEAR    Culture   Final    NO GROWTH < 24 HOURS Performed at [facility], [address], [place], [place] [address]    Report Status PENDING  Incomplete    Radiology Studies: No results found.     Romen Yutzy T. Brittay Mogle [facility]  If 7PM-7AM, please contact night-coverage [email or web address] 09/06/2023, 2:05 PM

## 2023-09-06 NOTE — Procedures (Signed)
Ultrasound-guided diagnostic and therapeutic left sided paracentesis performed yielding 5.8 liters of straw colored fluid.  Fluid was sent to lab for analysis. No immediate complications. EBL is none.

## 2023-09-06 NOTE — Progress Notes (Signed)
Patient is showing an increase in swelling in upper and lower extremities. Patient was cautioned to elevate extremities several times throughout the shift but has failed to do so.

## 2023-09-06 NOTE — Progress Notes (Signed)
Physical Therapy Treatment Patient Details Name: Ryan Weeks. MRN: [id number] DOB: 10-Nov-1950 Today's Date: 09/06/2023   History of Present Illness 72 yo male presents to therapy s/p R TKA on 09/02/2023 due to failure of conservative measures. Pt PMH includes but is not limited to: colitis, HTN, tobacco abuse, diplopia, HLD, s/p AVR, PAF, COPD, GERD, and lumbar disc surgery. Noted SOB, hypoxia, right pleaural effusion, S/P thoracentesis 12/12.    PT Comments  POD # 4 am session General Comments: AxO x 3 frustrated, wants to go home TODAY.  Sharred that his wife passed in July "Heart Failure".  Currently lives home alone but has "good family support".  Admits to drinking Nautral Lite and wine plus Tobbacco use. Stated, he will not wear any oxygen "once home".    Also stated, he was leaving "today by 4pm"  Pt scheduled for a Paracentesis "sometime" today. Assisted OOB to amb to bathroom was eventful.  General Gait Details: pt using a youth walker that was his Mom's and refuses [place] to order him a proper one.  assisted with amb to bathroom along with NT.  3/4 dyspnea.  Difficult to get a SATS Pt's fingers are cold/discolored.  Used an ear probe to get a reading of 88-90% at rest and 78-84% during activity.  Left Pt in bathroom upon request and instructed him to pull cord when ready.    If plan is discharge home, recommend the following: A little help with walking and/or transfers;A little help with bathing/dressing/bathroom;Assistance with cooking/housework;Assist for transportation;Help with stairs or ramp for entrance   Can travel by private vehicle        Equipment Recommendations  None recommended by PT    Recommendations for Other Services       Precautions / Restrictions Precautions Precautions: Fall;Knee Precaution Comments: monitor sats Restrictions Weight Bearing Restrictions Per Provider Order: No RLE Weight Bearing Per Provider Order: Weight bearing as tolerated      Mobility  Bed Mobility Overal bed mobility: Needs Assistance, Modified Independent             General bed mobility comments: self able with increased time and use of rail    Transfers Overall transfer level: Needs assistance Equipment used: Rolling walker (2 wheels) Transfers: Sit to/from Stand Sit to Stand: Supervision           General transfer comment: self able from bed as well as toilet transfer.  Pt declined assistance is bathroom and request privacy.    Ambulation/Gait Ambulation/Gait assistance: Supervision Gait Distance (Feet): 10 Feet Assistive device: Rolling walker (2 wheels) Gait Pattern/deviations: Step-to pattern, Step-through pattern Gait velocity: decr     General Gait Details: pt using a youth walker that was his Mom's and refuses [place] to order him a proper one.  assisted with amb to bathroom along with NT.  3/4 dyspnea.   Stairs             Wheelchair Mobility     Tilt Bed    Modified Rankin (Stroke Patients Only)       Balance                                            Cognition Arousal: Alert Behavior During Therapy: Restless, Anxious  General Comments: AxO x 3 frustrated, wants to go home TODAY.  Sharred that his wife passed in July "Heart Failure".  Currently lives home alone but has "good family support".  Admits to drinking Nautral Lite and wine plus Tobbacco use. Stated, he will not wear any oxygen "once home".    Also stated, he was leaving "today by 4pm"        Exercises      General Comments        Pertinent Vitals/Pain Pain Assessment Pain Assessment: Faces Faces Pain Scale: Hurts even more Pain Location: R knee Pain Descriptors / Indicators: Aching, Discomfort, Operative site guarding Pain Intervention(s): Monitored during session, Premedicated before session, Repositioned, Ice applied    Home Living                           Prior Function            PT Goals (current goals can now be found in the care plan section) Progress towards PT goals: Progressing toward goals    Frequency    7X/week      PT Plan      Co-evaluation              AM-PAC PT "6 Clicks" Mobility   Outcome Measure  Help needed turning from your back to your side while in a flat bed without using bedrails?: None Help needed moving from lying on your back to sitting on the side of a flat bed without using bedrails?: None Help needed moving to and from a bed to a chair (including a wheelchair)?: None Help needed standing up from a chair using your arms (e.g., wheelchair or bedside chair)?: None Help needed to walk in hospital room?: A Little Help needed climbing 3-5 steps with a railing? : A Little 6 Click Score: 22    End of Session Equipment Utilized During Treatment: Gait belt Activity Tolerance: Patient limited by fatigue Patient left: Other (comment) (in bathroom instructed to pull cord when finished) Nurse Communication: Mobility status PT Visit Diagnosis: Unsteadiness on feet (R26.81);Other abnormalities of gait and mobility (R26.89);Muscle weakness (generalized) (M62.81);Pain;Difficulty in walking, not elsewhere classified (R26.2) Pain - Right/Left: Right Pain - part of body: Knee;Leg     Time: 7829-5621 PT Time Calculation (min) (ACUTE ONLY): 24 min  Charges:    $Gait Training: 8-22 mins $Therapeutic Activity: 8-22 mins PT General Charges $$ ACUTE PT VISIT: 1 Visit                    Felecia Shelling  PTA Acute  Rehabilitation Services Office M-F          [phone]

## 2023-09-07 LAB — PROTEIN, PLEURAL OR PERITONEAL FLUID: Total protein, fluid: 3.6 g/dL

## 2023-09-07 LAB — ALBUMIN, PLEURAL OR PERITONEAL FLUID: Albumin, Fluid: 2 g/dL

## 2023-09-08 LAB — BODY FLUID CULTURE W GRAM STAIN: Culture: NO GROWTH

## 2023-09-09 LAB — CYTOLOGY - NON PAP

## 2023-09-09 NOTE — Discharge Summary (Signed)
Physician Discharge Summary   Patient ID: Ryan Weeks. MRN: [id number] DOB/AGE: [birth year] 72 y.o.  Admit date: 09/02/2023 Discharge date: 09/06/2023  Primary Diagnosis: Osteoarthritis right knee   Admission Diagnoses:  Past Medical History:  Diagnosis Date   Arthritis    COPD (chronic obstructive pulmonary disease) (HCC)    Dyspnea    with exertion   Family history of adverse reaction to anesthesia    son trouble getting put under   GERD (gastroesophageal reflux disease)    Hypercholesteremia    Hypertension    Pre-diabetes    Discharge Diagnoses:   Principal Problem:   OA (osteoarthritis) of knee Active Problems:   Hyperlipidemia   Hypertension   PAD (peripheral artery disease) (HCC)   Paroxysmal atrial fibrillation (HCC)   Tobacco abuse   Primary osteoarthritis of knee   Macrocytic anemia   Prediabetes   Thrombocytopenia (HCC)   Abdominal distention   Acute respiratory failure with hypoxia (HCC)   COPD exacerbation (HCC)   Atelectasis   Leukocytosis   Acute on chronic combined systolic and diastolic CHF (congestive heart failure) (HCC)   Cirrhosis of liver with ascites (HCC)   Bilateral pleural effusion  Estimated body mass index is 29.98 kg/m as calculated from the following:   Height as of this encounter: 5\' 9"  (1.753 m).   Weight as of this encounter: 92.1 kg.  Procedure:  Procedure(s) (LRB): TOTAL KNEE ARTHROPLASTY (Right)   Consults:  Hospitalist  HPI: Wilburn Lotspeich. is a 72 y.o. year old male with end stage OA of his right knee with progressively worsening pain and dysfunction. He has constant pain, with activity and at rest and significant functional deficits with difficulties even with ADLs. He has had extensive non-op management including analgesics, injections of cortisone and viscosupplements, and home exercise program, but remains in significant pain with significant dysfunction. Radiographs show bone on bone arthritis lateral and  patellofemoral. He presents now for right Total Knee Arthroplasty.  Laboratory Data: Admission on 09/02/2023, Discharged on 09/06/2023  Component Date Value Ref Range Status   WBC 09/03/2023 7.3  4.0 - 10.5 K/uL Final   RBC 09/03/2023 3.37 (L)  4.22 - 5.81 MIL/uL Final   Hemoglobin 09/03/2023 10.9 (L)  13.0 - 17.0 g/dL Final   HCT 40/98/1191 35.4 (L)  39.0 - 52.0 % Final   MCV 09/03/2023 105.0 (H)  80.0 - 100.0 fL Final   MCH 09/03/2023 32.3  26.0 - 34.0 pg Final   MCHC 09/03/2023 30.8  30.0 - 36.0 g/dL Final   RDW 47/82/9562 16.5 (H)  11.5 - 15.5 % Final   Platelets 09/03/2023 148 (L)  150 - 400 K/uL Final   nRBC 09/03/2023 0.0  0.0 - 0.2 % Final   Performed at [facility], [address] [place], [place] [address]   Sodium 09/03/2023 137  135 - 145 mmol/L Final   Potassium 09/03/2023 3.9  3.5 - 5.1 mmol/L Final   Chloride 09/03/2023 97 (L)  98 - 111 mmol/L Final   CO2 09/03/2023 29  22 - 32 mmol/L Final   Glucose, Bld 09/03/2023 177 (H)  70 - 99 mg/dL Final   Glucose reference range applies only to samples taken after fasting for at least 8 hours.   BUN 09/03/2023 27 (H)  8 - 23 mg/dL Final   Creatinine, Ser 09/03/2023 1.02  0.61 - 1.24 mg/dL Final   Calcium 57/84/6962 8.4 (L)  8.9 - 10.3 mg/dL Final   GFR,  Estimated 09/03/2023 >60  >60 mL/min Final   Comment: (NOTE) Calculated using the CKD-EPI Creatinine Equation (2021)    Anion gap 09/03/2023 11  5 - 15 Final   Performed at [facility], [address], [place], [place] [address]   WBC 09/04/2023 13.2 (H)  4.0 - 10.5 K/uL Final   RBC 09/04/2023 3.28 (L)  4.22 - 5.81 MIL/uL Final   Hemoglobin 09/04/2023 11.0 (L)  13.0 - 17.0 g/dL Final   HCT 95/28/4132 35.1 (L)  39.0 - 52.0 % Final   MCV 09/04/2023 107.0 (H)  80.0 - 100.0 fL Final   MCH 09/04/2023 33.5  26.0 - 34.0 pg Final   MCHC 09/04/2023 31.3  30.0 - 36.0 g/dL Final   RDW 44/09/270 16.6 (H)  11.5 - 15.5 % Final   Platelets  09/04/2023 153  150 - 400 K/uL Final   nRBC 09/04/2023 0.0  0.0 - 0.2 % Final   Performed at [facility], [address], [place], [place] [address]   Vitamin B-12 09/05/2023 492  180 - 914 pg/mL Final   Comment: (NOTE) This assay is not validated for testing neonatal or myeloproliferative syndrome specimens for Vitamin B12 levels. Performed at [facility], [address], [place], [place] [address]    WBC 09/05/2023 10.0  4.0 - 10.5 K/uL Final   RBC 09/05/2023 3.16 (L)  4.22 - 5.81 MIL/uL Final   Hemoglobin 09/05/2023 10.7 (L)  13.0 - 17.0 g/dL Final   HCT 42/59/5638 33.1 (L)  39.0 - 52.0 % Final   MCV 09/05/2023 104.7 (H)  80.0 - 100.0 fL Final   MCH 09/05/2023 33.9  26.0 - 34.0 pg Final   MCHC 09/05/2023 32.3  30.0 - 36.0 g/dL Final   RDW 75/64/3329 16.9 (H)  11.5 - 15.5 % Final   Platelets 09/05/2023 154  150 - 400 K/uL Final   nRBC 09/05/2023 0.0  0.0 - 0.2 % Final   Performed at [facility], [address] [place], [place] [address]   Sodium 09/05/2023 134 (L)  135 - 145 mmol/L Final   Potassium 09/05/2023 3.9  3.5 - 5.1 mmol/L Final   Chloride 09/05/2023 95 (L)  98 - 111 mmol/L Final   CO2 09/05/2023 30  22 - 32 mmol/L Final   Glucose, Bld 09/05/2023 82  70 - 99 mg/dL Final   Glucose reference range applies only to samples taken after fasting for at least 8 hours.   BUN 09/05/2023 38 (H)  8 - 23 mg/dL Final   Creatinine, Ser 09/05/2023 0.87  0.61 - 1.24 mg/dL Final   Calcium 16/60/6301 8.8 (L)  8.9 - 10.3 mg/dL Final   Total Protein 60/06/9322 7.0  6.5 - 8.1 g/dL Final   Albumin 55/73/2202 3.4 (L)  3.5 - 5.0 g/dL Final   AST 54/27/0623 20  15 - 41 U/L Final   ALT 09/05/2023 12  0 - 44 U/L Final   Alkaline Phosphatase 09/05/2023 104  38 - 126 U/L Final   Total Bilirubin 09/05/2023 1.1  <1.2 mg/dL Final   GFR, Estimated 09/05/2023 >60  >60 mL/min Final   Comment: (NOTE) Calculated using the CKD-EPI Creatinine  Equation (2021)    Anion gap 09/05/2023 9  5 - 15 Final   Performed at [facility], [address], [place], [place] [address]   Folate 09/05/2023 5.6 (L)  >5.9 ng/mL Final   Performed at [facility], [address] [place], [place] [address]   B Natriuretic Peptide 09/05/2023 3,840.7 (H)  0.0 - 100.0 pg/mL Final   Performed at [facility], [address] [place], [place] [address]   Procalcitonin 09/05/2023 <0.10  ng/mL Final   Comment:        Interpretation: PCT (Procalcitonin) <= 0.5 ng/mL: Systemic infection (sepsis) is not likely. Local bacterial infection is possible. (NOTE)       Sepsis PCT Algorithm           Lower Respiratory Tract                                      Infection PCT Algorithm    ----------------------------     ----------------------------         PCT < 0.25 ng/mL                PCT < 0.10 ng/mL          Strongly encourage             Strongly discourage   discontinuation of antibiotics    initiation of antibiotics    ----------------------------     -----------------------------       PCT 0.25 - 0.50 ng/mL            PCT 0.10 - 0.25 ng/mL               OR       >80% decrease in PCT            Discourage initiation of                                            antibiotics      Encourage discontinuation           of antibiotics    ----------------------------     -----------------------------         PCT >= 0.50 ng/mL              PCT 0.26 - 0.50 ng/mL               AND                                 <80% decrease in PCT             Encourage initiation of                                             antibiotics       Encourage continuation           of antibiotics    ----------------------------     -----------------------------        PCT >= 0.50 ng/mL                  PCT > 0.50 ng/mL               AND         increase in PCT  Strongly encourage                                       initiation of antibiotics    Strongly encourage escalation           of antibiotics                                     -----------------------------                                           PCT <= 0.25 ng/mL                                                 OR                                        > 80% decrease in PCT                                      Discontinue / Do not initiate                                             antibiotics  Performed at [facility], [address], [place], [place] [address]    LD, Fluid 09/05/2023 45 (H)  3 - 23 U/L Final   Comment: (NOTE) Results should be evaluated in conjunction with serum values    Fluid Type-FLDH 09/05/2023 Lung RLL   Final   Performed at [facility], [address] [place], [place] [address]   Fluid Type-FCT 09/05/2023 Lung RLL   Final   Color, Fluid 09/05/2023 YELLOW (A)  YELLOW Final   Appearance, Fluid 09/05/2023 CLEAR  CLEAR Final   Total Nucleated Cell Count, Fluid 09/05/2023 108  0 - 1,000 cu mm Final   Neutrophil Count, Fluid 09/05/2023 27 (H)  0 - 25 % Final   Lymphs, Fluid 09/05/2023 56  % Final   Monocyte-Macrophage-Serous Fluid 09/05/2023 17 (L)  50 - 90 % Final   Other Cells, Fluid 09/05/2023 CORRELATE WITH CYTOLOGY.  % Final   Performed at [facility], [address], [place], [place] [address]   Total protein, fluid 09/05/2023 <3.0  g/dL Final   Comment: (NOTE) No normal range established for this test Results should be evaluated in conjunction with serum values    Fluid Type-FTP 09/05/2023 Lung RLL   Final   Performed at [facility], [address], [place], [place] [address]   Specimen Description 09/05/2023    Final                   Value:PLEURAL Performed at [facility], [address] [place], [place] [address]    Special  Requests 09/05/2023    Final                   Value:NONE Performed  at [facility], [address] [place], [place] [address]    Gram Stain 09/05/2023    Final                   Value:WBC PRESENT,BOTH PMN AND MONONUCLEAR NO ORGANISMS SEEN CYTOSPIN SMEAR    Culture 09/05/2023    Final                   Value:NO GROWTH 3 DAYS Performed at [facility], [address] [place], [place] [address]    Report Status 09/05/2023 09/08/2023 FINAL   Final   LDH 09/05/2023 236 (H)  98 - 192 U/L Final   Performed at [facility], [address] [place], [place] [address]   Total Protein 09/05/2023 6.9  6.5 - 8.1 g/dL Final   Performed at [facility], [address] [place], [place] [address]   Sodium 09/06/2023 131 (L)  135 - 145 mmol/L Final   Potassium 09/06/2023 3.6  3.5 - 5.1 mmol/L Final   Chloride 09/06/2023 93 (L)  98 - 111 mmol/L Final   CO2 09/06/2023 28  22 - 32 mmol/L Final   Glucose, Bld 09/06/2023 72  70 - 99 mg/dL Final   Glucose reference range applies only to samples taken after fasting for at least 8 hours.   BUN 09/06/2023 45 (H)  8 - 23 mg/dL Final   Creatinine, Ser 09/06/2023 0.89  0.61 - 1.24 mg/dL Final   Calcium 18/84/1660 8.6 (L)  8.9 - 10.3 mg/dL Final   Phosphorus 63/09/6008 3.8  2.5 - 4.6 mg/dL Final   Albumin 93/23/5573 3.1 (L)  3.5 - 5.0 g/dL Final   GFR, Estimated 09/06/2023 >60  >60 mL/min Final   Comment: (NOTE) Calculated using the CKD-EPI Creatinine Equation (2021)    Anion gap 09/06/2023 10  5 - 15 Final   Performed at [facility], [address] [place], [place] [address]   Magnesium 09/06/2023 2.3  1.7 - 2.4 mg/dL Final   Performed at [facility], [address] [place], [place] [address]   WBC 09/06/2023 8.0  4.0 - 10.5 K/uL Final   RBC 09/06/2023 3.25 (L)  4.22 - 5.81 MIL/uL Final   Hemoglobin 09/06/2023 10.8 (L)  13.0 - 17.0 g/dL Final   HCT 23/76/2831 33.4 (L)  39.0 - 52.0 % Final   MCV 09/06/2023 102.8 (H)   80.0 - 100.0 fL Final   MCH 09/06/2023 33.2  26.0 - 34.0 pg Final   MCHC 09/06/2023 32.3  30.0 - 36.0 g/dL Final   RDW 51/76/1607 16.4 (H)  11.5 - 15.5 % Final   Platelets 09/06/2023 170  150 - 400 K/uL Final   nRBC 09/06/2023 0.0  0.0 - 0.2 % Final   Performed at [facility], [address] [place], [place] [address]   Fluid Type-FCT 09/06/2023 PERITONEAL   Corrected   CORRECTED ON 12/13 AT 1812: PREVIOUSLY REPORTED AS CYTO PERI   Color, Fluid 09/06/2023 YELLOW  YELLOW Final   Appearance, Fluid 09/06/2023 HAZY (A)  CLEAR Final   Total Nucleated Cell Count, Fluid 09/06/2023 188  0 - 1,000 cu mm Final   Neutrophil Count, Fluid 09/06/2023 31 (H)  0 - 25 % Final   Lymphs, Fluid 09/06/2023  18  % Final   Monocyte-Macrophage-Serous Fluid 09/06/2023 33 (L)  50 - 90 % Final   Eos, Fluid 09/06/2023 0  % Final   Other Cells, Fluid 09/06/2023 18  % Final   Comment: CORRELATE WITH CYTOLOGY. OTHER CELLS IDENTIFIED AS MESOTHELIAL CELLS Performed at [facility], [address], [place], [place] [address]    Albumin, Fluid 09/06/2023 2.0  g/dL Final   Fluid Type-FALB 09/06/2023 PERITONEAL   Corrected   Comment: Performed at [facility], [address] [place], [place] [address] CORRECTED ON 12/13 AT 1812: PREVIOUSLY REPORTED AS CYTO PERI    Total protein, fluid 09/06/2023 3.6  g/dL Final   Fluid Type-FTP 09/06/2023 PERITONEAL   Corrected   Comment: Performed at [facility], [address] [place], [place] [address] CORRECTED ON 12/13 AT 1812: PREVIOUSLY REPORTED AS CYTO PERI    Specimen Description 09/06/2023    Final                   Value:PERITONEAL Performed at [facility], [address], [place], [place] [address]    Special Requests 09/06/2023    Final                   Value:NONE Performed at [facility], [address] [place], [place] [address]    Gram Stain  09/06/2023    Final                   Value:WBC PRESENT, PREDOMINANTLY MONONUCLEAR NO ORGANISMS SEEN CYTOSPIN SMEAR    Culture 09/06/2023    Final                   Value:NO GROWTH 2 DAYS Performed at [facility], [address], [place], [place] [address]    Report Status 09/06/2023 PENDING   Incomplete  Hospital Outpatient Visit on 08/29/2023  Component Date Value Ref Range Status   MRSA, PCR 08/29/2023 NEGATIVE  NEGATIVE Final   Staphylococcus aureus 08/29/2023 NEGATIVE  NEGATIVE Final   Comment: (NOTE) The Xpert SA Assay (FDA approved for NASAL specimens in patients 49 years of age and older), is one component of a comprehensive surveillance program. It is not intended to diagnose infection nor to guide or monitor treatment. Performed at [facility], [address], [place], [place] [address]    Sodium 08/29/2023 134 (L)  135 - 145 mmol/L Final   Potassium 08/29/2023 4.1  3.5 - 5.1 mmol/L Final   Chloride 08/29/2023 96 (L)  98 - 111 mmol/L Final   CO2 08/29/2023 26  22 - 32 mmol/L Final   Glucose, Bld 08/29/2023 107 (H)  70 - 99 mg/dL Final   Glucose reference range applies only to samples taken after fasting for at least 8 hours.   BUN 08/29/2023 20  8 - 23 mg/dL Final   Creatinine, Ser 08/29/2023 0.82  0.61 - 1.24 mg/dL Final   Calcium 25/36/6440 8.5 (L)  8.9 - 10.3 mg/dL Final   GFR, Estimated 08/29/2023 >60  >60 mL/min Final   Comment: (NOTE) Calculated using the CKD-EPI Creatinine Equation (2021)    Anion gap 08/29/2023 12  5 - 15 Final   Performed at [facility], [address] [place], [place] [address]   WBC 08/29/2023 8.0  4.0 - 10.5 K/uL Final   RBC 08/29/2023 4.07 (L)  4.22 - 5.81 MIL/uL Final   Hemoglobin  08/29/2023 13.3  13.0 - 17.0 g/dL Final   HCT 66/44/0347 42.0  39.0 - 52.0 % Final   MCV 08/29/2023 103.2 (H)  80.0 - 100.0 fL Final   MCH 08/29/2023 32.7  26.0 - 34.0 pg Final   MCHC 08/29/2023 31.7  30.0 -  36.0 g/dL Final   RDW 42/59/5638 16.7 (H)  11.5 - 15.5 % Final   Platelets 08/29/2023 176  150 - 400 K/uL Final   nRBC 08/29/2023 0.0  0.0 - 0.2 % Final   Performed at [facility], [address], [place], [place] [address]  Hospital Outpatient Visit on 07/29/2023  Component Date Value Ref Range Status   MRSA, PCR 07/29/2023 NEGATIVE  NEGATIVE Final   Staphylococcus aureus 07/29/2023 NEGATIVE  NEGATIVE Final   Comment: (NOTE) The Xpert SA Assay (FDA approved for NASAL specimens in patients 26 years of age and older), is one component of a comprehensive surveillance program. It is not intended to diagnose infection nor to guide or monitor treatment. Performed at [facility], [address] [place], [place] [address]    WBC 07/29/2023 5.9  4.0 - 10.5 K/uL Final   RBC 07/29/2023 3.80 (L)  4.22 - 5.81 MIL/uL Final   Hemoglobin 07/29/2023 12.4 (L)  13.0 - 17.0 g/dL Final   HCT 88/41/6606 37.2 (L)  39.0 - 52.0 % Final   MCV 07/29/2023 97.9  80.0 - 100.0 fL Final   MCH 07/29/2023 32.6  26.0 - 34.0 pg Final   MCHC 07/29/2023 33.3  30.0 - 36.0 g/dL Final   RDW 30/16/0109 15.2  11.5 - 15.5 % Final   Platelets 07/29/2023 177  150 - 400 K/uL Final   nRBC 07/29/2023 0.0  0.0 - 0.2 % Final   Performed at [facility], [address], [place], [place] [address]   Sodium 07/29/2023 134 (L)  135 - 145 mmol/L Final   Potassium 07/29/2023 4.6  3.5 - 5.1 mmol/L Final   Chloride 07/29/2023 96 (L)  98 - 111 mmol/L Final   CO2 07/29/2023 29  22 - 32 mmol/L Final   Glucose, Bld 07/29/2023 108 (H)  70 - 99 mg/dL Final   Glucose reference range applies only to samples taken after fasting for at least 8 hours.   BUN 07/29/2023 22  8 - 23 mg/dL Final   Creatinine, Ser 07/29/2023 0.71  0.61 - 1.24 mg/dL Final   Calcium 73/22/0254 8.9  8.9 - 10.3 mg/dL Final   GFR, Estimated 07/29/2023 >60  >60 mL/min Final   Comment: (NOTE) Calculated using the  CKD-EPI Creatinine Equation (2021)    Anion gap 07/29/2023 9  5 - 15 Final   Performed at [facility], [address], [place], [place] [address]     X-Rays:IR Paracentesis Result Date: 09/06/2023 INDICATION: History of CHF. Found to have ascites and cirrhosis on recent CT. Team is requesting therapeutic and diagnostic paracentesis EXAM: ULTRASOUND GUIDED THERAPEUTIC AND DIAGNOSTIC LEFT-SIDED PARACENTESIS MEDICATIONS: Lidocaine 1% 10 mL COMPLICATIONS: None immediate. PROCEDURE: Informed written consent was obtained from the patient after a discussion of the risks, benefits and alternatives to treatment. A timeout was performed prior to the initiation of the procedure. Initial ultrasound scanning demonstrates a large amount of ascites within the right lower abdominal quadrant. The left lower abdomen was prepped and draped in the usual sterile fashion. 1% lidocaine was used for local anesthesia. Following this, a 19 gauge, 7-cm, Yueh catheter was introduced. An ultrasound  image was saved for documentation purposes. The paracentesis was performed. The catheter was removed and a dressing was applied. The patient tolerated the procedure well without immediate post procedural complication. FINDINGS: A total of approximately 5.8L of straw-colored fluid was removed. Samples were sent to the laboratory as requested by the clinical team. IMPRESSION: Successful ultrasound-guided therapeutic and diagnostic left-sided paracentesis yielding 5.8 L liters of peritoneal fluid. Performed by Anders Grant NP Electronically Signed   By: Corlis Leak M.D.   On: 09/06/2023 17:38   DG Chest Port 1 View Result Date: 09/05/2023 CLINICAL DATA:  [id number] Pleural effusion on right [id number] EXAM: PORTABLE CHEST 1 VIEW COMPARISON:  09/03/2023. FINDINGS: Redemonstration of small-to-moderate right pleural effusion, slightly decreased since the prior study. No pneumothorax. There are increased interstitial markings  throughout bilateral lungs, similar to the prior study. There is apparent linear interface overlying the lateral aspect of the left hemithorax however, bronchovascular markings are seen lateral to it and this is favored to represent artifact related to overlying object versus skin fold. Correlate clinically. In case of recent left hemithorax intervention, further evaluation with additional radiograph is recommended to exclude underlying pneumothorax. Bilateral lung fields are clear. Bilateral costophrenic angles are clear. Stable cardio-mediastinal silhouette. No acute osseous abnormalities. The soft tissues are within normal limits. IMPRESSION: *Small-to-moderate right pleural effusion, slightly decreased since the prior study. No pneumothorax. *Mild pulmonary vascular congestion, similar to the prior study. *Apparent linear interface overlying the left lateral hemithorax, as discussed above. Electronically Signed   By: Jules Schick M.D.   On: 09/05/2023 13:38   IR THORACENTESIS ASP PLEURAL SPACE W/IMG GUIDE Result Date: 09/05/2023 INDICATION: Patient with history of COPD, recent total knee replacement 09/02/23, found to have new right pleural effusion. Request for diagnotic and therapeutic right thoracentesis. EXAM: ULTRASOUND GUIDED RIGHT THORACENTESIS MEDICATIONS: 6 mL 1% lidocaine. COMPLICATIONS: None immediate. PROCEDURE: An ultrasound guided thoracentesis was thoroughly discussed with the patient and questions answered. The benefits, risks, alternatives and complications were also discussed. The patient understands and wishes to proceed with the procedure. Written consent was obtained. Ultrasound was performed to localize and mark an adequate pocket of fluid in the right chest. The area was then prepped and draped in the normal sterile fashion. 1% Lidocaine was used for local anesthesia. Under ultrasound guidance a 6 Fr Safe-T-Centesis catheter was introduced. Thoracentesis was performed. The catheter  was removed and a dressing applied. FINDINGS: A total of approximately 1.0 L of clear yellow fluid was removed. Samples were sent to the laboratory as requested by the clinical team. IMPRESSION: Successful ultrasound guided right thoracentesis yielding 1.0 L of pleural fluid. Performed by Lynnette Caffey, PA-C Electronically Signed   By: Richarda Overlie M.D.   On: 09/05/2023 13:28   CT ABDOMEN PELVIS W CONTRAST Result Date: 09/04/2023 CLINICAL DATA:  Abdominal pain.  Concern for bowel obstruction. EXAM: CT ABDOMEN AND PELVIS WITH CONTRAST TECHNIQUE: Multidetector CT imaging of the abdomen and pelvis was performed using the standard protocol following bolus administration of intravenous contrast. RADIATION DOSE REDUCTION: This exam was performed according to the departmental dose-optimization program which includes automated exposure control, adjustment of the mA and/or kV according to patient size and/or use of iterative reconstruction technique. CONTRAST:  OMNIPAQUE IOHEXOL 300 MG/ML  SOLN COMPARISON:  Chest radiograph dated 09/03/2023. FINDINGS: Lower chest: Partially visualized bilateral pleural effusions, right greater than left. Areas of airspace opacity in the right lung may represent atelectasis or pneumonia. Aortic valve repair. There is coronary vascular calcification. Moderate  ascites.  No free air. Hepatobiliary: Cirrhosis. No biliary ductal dilatation. The gallbladder is unremarkable Pancreas: The pancreas is mildly atrophic otherwise unremarkable. Spleen: Normal in size without focal abnormality. Adrenals/Urinary Tract: The adrenal glands are poorly visualized. There is no hydronephrosis on either side. The urinary bladder is grossly unremarkable. Stomach/Bowel: There is sigmoid diverticulosis. There is no bowel obstruction. The appendix is normal. Vascular/Lymphatic: Advanced aortoiliac atherosclerotic disease. The IVC is unremarkable. No portal venous gas. No obvious adenopathy. Reproductive:  The prostate and seminal vesicles are grossly unremarkable. Other: Diffuse subcutaneous edema and anasarca. Musculoskeletal: Degenerative changes of the spine. No acute osseous pathology. IMPRESSION: 1. Cirrhosis with moderate ascites and anasarca. 2. Sigmoid diverticulosis. No bowel obstruction. Normal appendix. 3. Partially visualized bilateral pleural effusions, right greater than left. Areas of airspace opacity in the right lung may represent atelectasis or pneumonia. 4.  Aortic Atherosclerosis (ICD10-I70.0). Electronically Signed   By: Elgie Collard M.D.   On: 09/04/2023 22:39   DG CHEST PORT 1 VIEW Result Date: 09/03/2023 CLINICAL DATA:  Shortness of breath. EXAM: PORTABLE CHEST 1 VIEW COMPARISON:  Chest radiograph dated 09/02/2023. FINDINGS: Similar or minimally decreased right pleural effusion and right lung base atelectasis or infiltrate. No pneumothorax. There is cardiomegaly with vascular congestion slightly improved since the prior radiograph. Atherosclerotic calcification of the aorta. No acute osseous pathology. IMPRESSION: 1. Similar or minimally decreased right pleural effusion and right lung base atelectasis or infiltrate. 2. Cardiomegaly with vascular congestion slightly improved since the prior radiograph. Electronically Signed   By: Elgie Collard M.D.   On: 09/03/2023 18:38   X-ray chest PA or AP Result Date: 09/02/2023 CLINICAL DATA:  Short of breath, postop right knee replacement EXAM: CHEST  1 VIEW COMPARISON:  None Available. FINDINGS: Single frontal view of the chest demonstrates an enlarged cardiac silhouette, with evidence of prior aortic valve replacement. There is marked pulmonary vascular congestion, with opacification of the lower right hemithorax consistent with consolidation and underlying effusion. No pneumothorax. No acute bony abnormalities. IMPRESSION: 1. Enlarged cardiac silhouette with diffuse pulmonary vascular congestion consistent with congestive heart failure.  2. Right basilar consolidation and large right effusion could reflect aspiration given recent postoperative state, atelectasis, infection, or asymmetric edema. Electronically Signed   By: Sharlet Salina M.D.   On: 09/02/2023 16:36    EKG: Orders placed or performed in visit on 08/29/23   EKG 12-Lead   EKG 12-Lead   EKG 12-Lead     Hospital Course: Curry Conk. is a 72 y.o. who was admitted to [facility]. They were brought to the operating room on 09/02/2023 and underwent Procedure(s): TOTAL KNEE ARTHROPLASTY.  Patient tolerated the procedure well and was later transferred to the recovery room and then to the orthopaedic floor for postoperative care. They were given PO and IV analgesics for pain control following their surgery. They were given 24 hours of postoperative antibiotics of  Anti-infectives (From admission, onward)    Start     Dose/Rate Route Frequency Ordered Stop   09/02/23 1900  ceFAZolin (ANCEF) IVPB 2g/100 mL premix        2 g 200 mL/hr over 30 Minutes Intravenous Every 6 hours 09/02/23 1649 09/03/23 0701   09/02/23 1030  ceFAZolin (ANCEF) IVPB 2g/100 mL premix        2 g 200 mL/hr over 30 Minutes Intravenous On call to O.R. 09/02/23 1023 09/02/23 1331     and started on DVT prophylaxis in the form of Aspirin.  PT and OT were ordered for total joint protocol. Discharge planning consulted to help with post-op disposition and equipment needs. Patient had a fair night on the evening of surgery. They started to get up OOB with physical therapy on POD #0. On POD #2, hospitalist consulted due to low oxygen saturation and need for supplemental oxygen as well as management of multiple other comorbidities. Patient underwent extensive work-up and had a thoracentesis on POD #3 and paracentesis on POD #4. Throughout his hospital stay, he continued to work with physical therapy for a total of 8 sessions. Despite recommendations to stay for further medical evaluation and  stabilization, patient was adamant on discharging home on POD #4. Patient left in stable condition.  Diet: Cardiac diet Activity: WBAT Follow-up: in 2 weeks Disposition: Home Discharged Condition: stable   Discharge Instructions     Call MD / Call 911   Complete by: As directed    If you experience chest pain or shortness of breath, CALL 911 and be transported to the hospital emergency room.  If you develope a fever above 101 F, pus (white drainage) or increased drainage or redness at the wound, or calf pain, call your surgeon's office.   Call MD / Call 911   Complete by: As directed    If you experience chest pain or shortness of breath, CALL 911 and be transported to the hospital emergency room.  If you develope a fever above 101 F, pus (white drainage) or increased drainage or redness at the wound, or calf pain, call your surgeon's office.   Change dressing   Complete by: As directed    You may remove the bulky bandage (ACE wrap and gauze) two days after surgery. You will have an adhesive waterproof bandage underneath. Leave this in place until your first follow-up appointment.   Constipation Prevention   Complete by: As directed    Drink plenty of fluids.  Prune juice may be helpful.  You may use a stool softener, such as Colace (over the counter) 100 mg twice a day.  Use MiraLax (over the counter) for constipation as needed.   Constipation Prevention   Complete by: As directed    Drink plenty of fluids.  Prune juice may be helpful.  You may use a stool softener, such as Colace (over the counter) 100 mg twice a day.  Use MiraLax (over the counter) for constipation as needed.   Diet - low sodium heart healthy   Complete by: As directed    Diet - low sodium heart healthy   Complete by: As directed    Do not put a pillow under the knee. Place it under the heel.   Complete by: As directed    Driving restrictions   Complete by: As directed    No driving for two weeks   Increase  activity slowly as tolerated   Complete by: As directed    Post-operative opioid taper instructions:   Complete by: As directed    POST-OPERATIVE OPIOID TAPER INSTRUCTIONS: It is important to wean off of your opioid medication as soon as possible. If you do not need pain medication after your surgery it is ok to stop day one. Opioids include: Codeine, Hydrocodone(Norco, Vicodin), Oxycodone(Percocet, oxycontin) and hydromorphone amongst others.  Long term and even short term use of opiods can cause: Increased pain response Dependence Constipation Depression Respiratory depression And more.  Withdrawal symptoms can include Flu like symptoms Nausea, vomiting And more Techniques to manage these symptoms Hydrate  well Eat regular healthy meals Stay active Use relaxation techniques(deep breathing, meditating, yoga) Do Not substitute Alcohol to help with tapering If you have been on opioids for less than two weeks and do not have pain than it is ok to stop all together.  Plan to wean off of opioids This plan should start within one week post op of your joint replacement. Maintain the same interval or time between taking each dose and first decrease the dose.  Cut the total daily intake of opioids by one tablet each day Next start to increase the time between doses. The last dose that should be eliminated is the evening dose.      Post-operative opioid taper instructions:   Complete by: As directed    POST-OPERATIVE OPIOID TAPER INSTRUCTIONS: It is important to wean off of your opioid medication as soon as possible. If you do not need pain medication after your surgery it is ok to stop day one. Opioids include: Codeine, Hydrocodone(Norco, Vicodin), Oxycodone(Percocet, oxycontin) and hydromorphone amongst others.  Long term and even short term use of opiods can cause: Increased pain response Dependence Constipation Depression Respiratory depression And more.  Withdrawal symptoms  can include Flu like symptoms Nausea, vomiting And more Techniques to manage these symptoms Hydrate well Eat regular healthy meals Stay active Use relaxation techniques(deep breathing, meditating, yoga) Do Not substitute Alcohol to help with tapering If you have been on opioids for less than two weeks and do not have pain than it is ok to stop all together.  Plan to wean off of opioids This plan should start within one week post op of your joint replacement. Maintain the same interval or time between taking each dose and first decrease the dose.  Cut the total daily intake of opioids by one tablet each day Next start to increase the time between doses. The last dose that should be eliminated is the evening dose.      TED hose   Complete by: As directed    Use stockings (TED hose) for three weeks on both leg(s).  You may remove them at night for sleeping.   Weight bearing as tolerated   Complete by: As directed       Allergies as of 09/06/2023   No Known Allergies      Medication List     TAKE these medications    acetaminophen 500 MG tablet Commonly known as: TYLENOL Take 1,000 mg by mouth every 6 (six) hours as needed for moderate pain (pain score 4-6).   albuterol 108 (90 Base) MCG/ACT inhaler Commonly known as: VENTOLIN HFA Inhale 2 puffs into the lungs every 4 (four) hours as needed for wheezing or shortness of breath.   aspirin 81 MG chewable tablet Chew 1 tablet (81 mg total) by mouth 2 (two) times daily for 20 days. Then take one 81 mg aspirin once a day for three weeks. Then discontinue aspirin.   bumetanide 2 MG tablet Commonly known as: BUMEX Take 2-3 mg by mouth See admin instructions. Alternate taking 2 mg one day and 3 mg the next   citalopram 20 MG tablet Commonly known as: CELEXA Take 20 mg by mouth every morning.   guaiFENesin 600 MG 12 hr tablet Commonly known as: MUCINEX Take 600 mg by mouth 2 (two) times daily as needed (congestion).    losartan 100 MG tablet Commonly known as: COZAAR Take 100 mg by mouth in the morning.   Magnesium 500 MG Tabs Take 500 mg by  mouth at bedtime.   methocarbamol 500 MG tablet Commonly known as: ROBAXIN Take 1 tablet (500 mg total) by mouth every 6 (six) hours as needed for muscle spasms.   metoprolol succinate 25 MG 24 hr tablet Commonly known as: TOPROL-XL Take 25 mg by mouth every morning.   ondansetron 4 MG tablet Commonly known as: ZOFRAN Take 1 tablet (4 mg total) by mouth every 6 (six) hours as needed for nausea.   oxyCODONE 5 MG immediate release tablet Commonly known as: Oxy IR/ROXICODONE Take 1-2 tablets (5-10 mg total) by mouth every 6 (six) hours as needed for severe pain (pain score 7-10) or moderate pain (pain score 4-6).   pravastatin 80 MG tablet Commonly known as: PRAVACHOL Take 1 tablet (80 mg total) by mouth at bedtime.               Discharge Care Instructions  (From admission, onward)           Start     Ordered   09/03/23 0000  Weight bearing as tolerated        09/03/23 0833   09/03/23 0000  Change dressing       Comments: You may remove the bulky bandage (ACE wrap and gauze) two days after surgery. You will have an adhesive waterproof bandage underneath. Leave this in place until your first follow-up appointment.   09/03/23 2841            Follow-up Information     Ollen Gross, MD. Schedule an appointment as soon as possible for a visit in 2 week(s).   Specialty: Orthopedic Surgery Contact information: [address] [place] [address] [place] [address] [phone]                 Signed: R. Arcola Jansky, PA-C Orthopedic Surgery 09/09/2023, 8:30 AM

## 2023-09-10 LAB — CYTOLOGY - NON PAP

## 2023-09-10 LAB — BODY FLUID CULTURE W GRAM STAIN

## 2023-10-26 DEATH — deceased
